# Patient Record
Sex: Female | Born: 1995 | Race: White | Hispanic: No | Marital: Single | Smoking: Never smoker
Health system: Southern US, Community
[De-identification: ages and names within clinical notes are randomized; demographics above are authoritative.]

## PROBLEM LIST (undated history)

## (undated) ENCOUNTER — Inpatient Hospital Stay: Payer: Self-pay

## (undated) DIAGNOSIS — Z9889 Other specified postprocedural states: Secondary | ICD-10-CM

## (undated) DIAGNOSIS — R519 Headache, unspecified: Secondary | ICD-10-CM

## (undated) DIAGNOSIS — D649 Anemia, unspecified: Secondary | ICD-10-CM

## (undated) DIAGNOSIS — R112 Nausea with vomiting, unspecified: Secondary | ICD-10-CM

## (undated) DIAGNOSIS — T4145XA Adverse effect of unspecified anesthetic, initial encounter: Secondary | ICD-10-CM

## (undated) DIAGNOSIS — T8859XA Other complications of anesthesia, initial encounter: Secondary | ICD-10-CM

## (undated) DIAGNOSIS — K219 Gastro-esophageal reflux disease without esophagitis: Secondary | ICD-10-CM

## (undated) HISTORY — PX: CHOLECYSTECTOMY: SHX55

## (undated) HISTORY — PX: WISDOM TOOTH EXTRACTION: SHX21

---
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
## 2015-08-21 ENCOUNTER — Encounter: Payer: Self-pay | Admitting: Emergency Medicine

## 2015-08-21 ENCOUNTER — Emergency Department
Admission: EM | Admit: 2015-08-21 | Discharge: 2015-08-22 | Disposition: A | Discharge status: Home / Self Care | Payer: Self-pay | Attending: Emergency Medicine | Admitting: Emergency Medicine

## 2015-08-21 DIAGNOSIS — J020 Streptococcal pharyngitis: Secondary | ICD-10-CM | POA: Insufficient documentation

## 2015-08-21 NOTE — ED Notes (Signed)
Pt arrived to the ED for complaints of sore throat for 1 day. Pt states that had strep throat many times before and this feels like it. Pt's tonsils are +2, Pt AOx4 in no apparent distress.

## 2015-08-21 NOTE — ED Provider Notes (Signed)
[facility] Emergency Department Provider Note  ____________________________________________  Time seen: Approximately 11:54 PM  I have reviewed the triage vital signs and the nursing notes.   HISTORY  Chief Complaint Sore Throat    HPI Janet Greer is a 20 y.o. female who presents emergency department for a sore throat times one day. Per the patient patient has had strep throat many times. She states that symptoms began rather [place]. She endorses fevers and chills, no cough, swelling of lymph nodes. Patient denies any nasal congestion, headache, visual acuity changes, neck pain, chest pain, abdominal pain, nausea or vomiting.   History reviewed. No pertinent past medical history.  There are no active problems to display for this patient.   History reviewed. No pertinent past surgical history.  Current Outpatient Rx  Name  Route  Sig  Dispense  Refill  . amoxicillin (AMOXIL) 875 MG tablet   Oral   Take 1 tablet (875 mg total) by mouth 2 (two) times daily.   14 tablet   0   . magic mouthwash w/lidocaine SOLN   Oral   Take 5 mLs by mouth 4 (four) times daily.   240 mL   0     Dispense in a 1/1/1/1 ratio. Use lidocaine, diphen ...     Allergies Review of patient's allergies indicates no known allergies.  History reviewed. No pertinent family history.  Social History Social History  Substance Use Topics  . Smoking status: Never Smoker   . Smokeless tobacco: None  . Alcohol Use: No     Review of Systems  Constitutional: Positive for fever/chills ENT: Positive for sore throat. Negative for nasal congestion. Cardiovascular: no chest pain. Respiratory: no cough. No SOB. Gastrointestinal: No abdominal pain.  No nausea, no vomiting.   Musculoskeletal: Negative for back pain. Skin: Negative for rash. Neurological: Negative for headaches, focal weakness or numbness. 10-point ROS otherwise  negative.  ____________________________________________   PHYSICAL EXAM:  VITAL SIGNS: ED Triage Vitals  Enc Vitals Group     BP 08/21/15 2204 128/72 mmHg     Pulse Rate 08/21/15 2204 120     Resp 08/21/15 2204 18     Temp 08/21/15 2204 98.4 F (36.9 C)     Temp Source 08/21/15 2204 Oral     SpO2 08/21/15 2204 99 %     Weight 08/21/15 2205 181 lb (82.101 kg)     Height 08/21/15 2204 5' (1.524 m)     Head Cir --      Peak Flow --      Pain Score 08/21/15 2205 8     Pain Loc --      Pain Edu? --      Excl. in GC? --      Constitutional: Alert and oriented. Well appearing and in no acute distress. Eyes: Conjunctivae are normal. PERRL. EOMI. Head: Atraumatic. ENT:      Ears: EACs and TMs are unremarkable bilaterally.      Nose: No congestion/rhinnorhea.      Mouth/Throat: Mucous membranes are moist. Oropharynx is erythematous. Uvula is midline. Tonsils are erythematous and edematous with white exudates. Neck: No stridor.   Hematological/Lymphatic/Immunilogical: Diffuse, tender, mobile anterior cervical lymphadenopathy. Cardiovascular: Normal rate, regular rhythm. Normal S1 and S2.  Good peripheral circulation. Respiratory: Normal respiratory effort without tachypnea or retractions. Lungs CTAB. Musculoskeletal: No lower extremity tenderness nor edema.  No joint effusions. Neurologic:  Normal speech and language. No gross focal neurologic deficits are appreciated.  Skin:  Skin is warm, dry and intact. No rash noted. Psychiatric: Mood and affect are normal. Speech and behavior are normal. Patient exhibits appropriate insight and judgement.   ____________________________________________   LABS (all labs ordered are listed, but only abnormal results are displayed)  Labs Reviewed  CULTURE, GROUP A STREP [facility])   ____________________________________________  EKG   ____________________________________________  RADIOLOGY   No results  found.  ____________________________________________    PROCEDURES  Procedure(s) performed:       Medications - No data to display   ____________________________________________   INITIAL IMPRESSION / ASSESSMENT AND PLAN / ED COURSE  Pertinent labs & imaging results that were available during my care of the patient were reviewed by me and considered in my medical decision making (see chart for details).  Patient's diagnosis is consistent with strep throat. Patient has a negative rapid strep exam but needs 4 out of 5 Centor criteria. Patient will be treated with antibiotics and magic mouthwash for symptom control. She is to take Tylenol and ibuprofen at home for additional symptom control..Patient is to follow up with primary care provider if symptoms persist past this treatment course. Patient is given ED precautions to return to the ED for any worsening or new symptoms.     ____________________________________________  FINAL CLINICAL IMPRESSION(S) / ED DIAGNOSES  Final diagnoses:  Strep throat      NEW MEDICATIONS STARTED DURING THIS VISIT:  New Prescriptions   AMOXICILLIN (AMOXIL) 875 MG TABLET    Take 1 tablet (875 mg total) by mouth 2 (two) times daily.   MAGIC MOUTHWASH W/LIDOCAINE SOLN    Take 5 mLs by mouth 4 (four) times daily.       Delorise Royals Cuthriell, PA-C 08/22/15 0004  Phineas Semen, MD 08/22/15 [phone]

## 2015-08-22 LAB — POCT RAPID STREP A: Streptococcus, Group A Screen (Direct): NEGATIVE

## 2015-08-22 MED ORDER — AMOXICILLIN 875 MG PO TABS: 875.0000 mg | ORAL_TABLET | Freq: Two times a day (BID) | ORAL | Status: DC

## 2015-08-22 MED ORDER — AMOXICILLIN-POT CLAVULANATE 875-125 MG PO TABS
ORAL_TABLET | ORAL | Status: AC
  Administered 2015-08-22: 1 via ORAL
  Filled 2015-08-22: qty 1

## 2015-08-22 MED ORDER — MAGIC MOUTHWASH W/LIDOCAINE: 5.0000 mL | Freq: Four times a day (QID) | ORAL | Status: DC

## 2015-08-22 MED ORDER — AMOXICILLIN-POT CLAVULANATE 875-125 MG PO TABS
1.0000 | ORAL_TABLET | Freq: Once | ORAL | Status: AC
  Administered 2015-08-22: 1 via ORAL

## 2015-08-22 NOTE — Discharge Instructions (Signed)

## 2015-08-23 LAB — CULTURE, GROUP A STREP (THRC)

## 2015-10-02 ENCOUNTER — Emergency Department
Admission: EM | Admit: 2015-10-02 | Discharge: 2015-10-02 | Disposition: A | Discharge status: Home / Self Care | Payer: Self-pay | Attending: Emergency Medicine | Admitting: Emergency Medicine

## 2015-10-02 DIAGNOSIS — N3001 Acute cystitis with hematuria: Secondary | ICD-10-CM | POA: Insufficient documentation

## 2015-10-02 DIAGNOSIS — Z792 Long term (current) use of antibiotics: Secondary | ICD-10-CM | POA: Insufficient documentation

## 2015-10-02 DIAGNOSIS — Z3202 Encounter for pregnancy test, result negative: Secondary | ICD-10-CM | POA: Insufficient documentation

## 2015-10-02 DIAGNOSIS — Z79899 Other long term (current) drug therapy: Secondary | ICD-10-CM | POA: Insufficient documentation

## 2015-10-02 DIAGNOSIS — F17200 Nicotine dependence, unspecified, uncomplicated: Secondary | ICD-10-CM | POA: Insufficient documentation

## 2015-10-02 LAB — URINALYSIS COMPLETE WITH MICROSCOPIC (ARMC ONLY)
BACTERIA UA: NONE SEEN
BILIRUBIN URINE: NEGATIVE
GLUCOSE, UA: NEGATIVE mg/dL
Ketones, ur: NEGATIVE mg/dL
NITRITE: NEGATIVE
Protein, ur: 100 mg/dL — AB
SPECIFIC GRAVITY, URINE: 1.028 (ref 1.005–1.030)
pH: 6 (ref 5.0–8.0)

## 2015-10-02 LAB — POCT PREGNANCY, URINE: PREG TEST UR: NEGATIVE

## 2015-10-02 MED ORDER — CEPHALEXIN 500 MG PO CAPS: 500.0000 mg | ORAL_CAPSULE | Freq: Four times a day (QID) | ORAL | Status: AC

## 2015-10-02 MED ORDER — ONDANSETRON 4 MG PO TBDP: 4.0000 mg | ORAL_TABLET | Freq: Three times a day (TID) | ORAL | Status: DC | PRN

## 2015-10-02 MED ORDER — CEPHALEXIN 500 MG PO CAPS
500.0000 mg | ORAL_CAPSULE | Freq: Once | ORAL | Status: AC
  Administered 2015-10-02: 500 mg via ORAL
  Filled 2015-10-02: qty 1

## 2015-10-02 MED ORDER — PHENAZOPYRIDINE HCL 100 MG PO TABS: 100.0000 mg | ORAL_TABLET | Freq: Three times a day (TID) | ORAL | Status: DC | PRN

## 2015-10-02 NOTE — Discharge Instructions (Signed)
Urinary Tract Infection Urinary tract infections (UTIs) can develop anywhere along your urinary tract. Your urinary tract is your body's drainage system for removing wastes and extra water. Your urinary tract includes two kidneys, two ureters, a bladder, and a urethra. Your kidneys are a pair of bean-shaped organs. Each kidney is about the size of your fist. They are located below your ribs, one on each side of your spine. CAUSES Infections are caused by microbes, which are microscopic organisms, including fungi, viruses, and bacteria. These organisms are so small that they can only be seen through a microscope. Bacteria are the microbes that most commonly cause UTIs. SYMPTOMS  Symptoms of UTIs may vary by age and gender of the patient and by the location of the infection. Symptoms in young women typically include a frequent and intense urge to urinate and a painful, burning feeling in the bladder or urethra during urination. Older women and men are more likely to be tired, shaky, and weak and have muscle aches and abdominal pain. A fever may mean the infection is in your kidneys. Other symptoms of a kidney infection include pain in your back or sides below the ribs, nausea, and vomiting. DIAGNOSIS To diagnose a UTI, your caregiver will ask you about your symptoms. Your caregiver will also ask you to provide a urine sample. The urine sample will be tested for bacteria and white blood cells. White blood cells are made by your body to help fight infection. TREATMENT  Typically, UTIs can be treated with medication. Because most UTIs are caused by a bacterial infection, they usually can be treated with the use of antibiotics. The choice of antibiotic and length of treatment depend on your symptoms and the type of bacteria causing your infection. HOME CARE INSTRUCTIONS  If you were prescribed antibiotics, take them exactly as your caregiver instructs you. Finish the medication even if you feel better after  you have only taken some of the medication.  Drink enough water and fluids to keep your urine clear or pale yellow.  Avoid caffeine, tea, and carbonated beverages. They tend to irritate your bladder.  Empty your bladder often. Avoid holding urine for long periods of time.  Empty your bladder before and after sexual intercourse.  After a bowel movement, women should cleanse from front to back. Use each tissue only once. SEEK MEDICAL CARE IF:   You have back pain.  You develop a fever.  Your symptoms do not begin to resolve within 3 days. SEEK IMMEDIATE MEDICAL CARE IF:   You have severe back pain or lower abdominal pain.  You develop chills.  You have nausea or vomiting.  You have continued burning or discomfort with urination. MAKE SURE YOU:   Understand these instructions.  Will watch your condition.  Will get help right away if you are not doing well or get worse.   This information is not intended to replace advice given to you by your health care provider. Make sure you discuss any questions you have with your health care provider.   Document Released: 04/15/2005 Document Revised: 03/27/2015 Document Reviewed: 08/14/2011 Elsevier Interactive Patient Education 2016 [employer]  Intravenous Pyelogram, Care After These instructions give you information about caring for yourself after your procedure. Your doctor may also give you more specific instructions. Call your doctor if you have any problems or questions after your procedure. HOME CARE INSTRUCTIONS  Return to your normal activities as told by your doctor. Ask your doctor what activities are safe for  you.  Drink enough fluid to keep your pee (urine) clear or pale yellow.  It is your responsibility to get the results of your procedure. Ask your doctor when your results will be ready. GET HELP IF: You start peeing less than you usually do. GET HELP RIGHT AWAY IF:  You feel sick to your stomach (are  nauseous).  You throw up (vomit).  You have itching.  You have trouble breathing.  Your throat swells.  You have chest pain.  You have chills or a fever.   This information is not intended to replace advice given to you by your health care provider. Make sure you discuss any questions you have with your health care provider.   Document Released: 03/27/2015 Document Reviewed: 09/20/2014 Elsevier Interactive Patient Education [employer]

## 2015-10-02 NOTE — ED Notes (Signed)
Patient presents to ED with chief complaint that she "cannot pee." Symptoms started a day ago. States some left lower quadrant pain but only if she leans to the left. States nausea without vomiting.

## 2015-10-02 NOTE — ED Notes (Signed)
Pt unable to urinate at this time given cup for when is able to void.

## 2015-10-02 NOTE — ED Provider Notes (Signed)
[facility] Emergency Department Provider Note  ____________________________________________  Time seen: Approximately 255 AM  I have reviewed the triage vital signs and the nursing notes.   HISTORY  Chief Complaint Urinary Retention    HPI Janet Greer is a 20 y.o. female who comes into the hospital today with urinary frequency and urgency. The patient reports that she feels as though she has to urinate frequently but whenever she urinates not much comes out. She reports the symptoms started 2 days ago. She reports that she thought it would go away but it didn't. The patient denies pain with urination and when she strains for a hard. She's had some nausea with no vomiting and no fevers. The patient has never had a urinary tract infection before. She has some mild lower abdominal suprapubic pain that she rates a 3 out of 10 in intensity. The patient was unsure what was going on she decided to come in to get checked out today. Initially arrived she told the staff that she could not pee. The patient is not taking anything for pain at home.The patient denies any vaginal discharge, burning, itching or any other vaginal symptoms.   History reviewed. No pertinent past medical history.  There are no active problems to display for this patient.   Past Surgical History  Procedure Laterality Date  . Wisdom tooth extraction      2014    Current Outpatient Rx  Name  Route  Sig  Dispense  Refill  . amoxicillin (AMOXIL) 875 MG tablet   Oral   Take 1 tablet (875 mg total) by mouth 2 (two) times daily.   14 tablet   0   . cephALEXin (KEFLEX) 500 MG capsule   Oral   Take 1 capsule (500 mg total) by mouth 4 (four) times daily.   40 capsule   0   . magic mouthwash w/lidocaine SOLN   Oral   Take 5 mLs by mouth 4 (four) times daily.   240 mL   0     Dispense in a 1/1/1/1 ratio. Use lidocaine, diphen ...   . ondansetron (ZOFRAN ODT) 4 MG disintegrating  tablet   Oral   Take 1 tablet (4 mg total) by mouth every 8 (eight) hours as needed for nausea or vomiting.   20 tablet   0   . phenazopyridine (PYRIDIUM) 100 MG tablet   Oral   Take 1 tablet (100 mg total) by mouth 3 (three) times daily as needed for pain.   10 tablet   0     Allergies Review of patient's allergies indicates no known allergies.  No family history on file.  Social History Social History  Substance Use Topics  . Smoking status: Current Some Day Smoker  . Smokeless tobacco: None  . Alcohol Use: No    Review of Systems Constitutional: No fever/chills Eyes: No visual changes. ENT: No sore throat. Cardiovascular: Denies chest pain. Respiratory: Denies shortness of breath. Gastrointestinal: Suprapubic pain and nausea, no vomiting.  No diarrhea.  No constipation. Genitourinary: Frequency, urgency and hesitancy Musculoskeletal: Negative for back pain. Skin: Negative for rash. Neurological: Negative for headaches, focal weakness or numbness.  10-point ROS otherwise negative.  ____________________________________________   PHYSICAL EXAM:  VITAL SIGNS: ED Triage Vitals  Enc Vitals Group     BP 10/02/15 0044 112/68 mmHg     Pulse Rate 10/02/15 0044 94     Resp 10/02/15 0044 18     Temp 10/02/15 0044  98 F (36.7 C)     Temp Source 10/02/15 0044 Oral     SpO2 10/02/15 0044 98 %     Weight 10/02/15 0044 170 lb (77.111 kg)     Height 10/02/15 0044 5' (1.524 m)     Head Cir --      Peak Flow --      Pain Score 10/02/15 0038 5     Pain Loc --      Pain Edu? --      Excl. in GC? --     Constitutional: Alert and oriented. Well appearing and in Mild distress. Eyes: Conjunctivae are normal. PERRL. EOMI. Head: Atraumatic. Nose: No congestion/rhinnorhea. Mouth/Throat: Mucous membranes are moist.  Oropharynx non-erythematous. Cardiovascular: Normal rate, regular rhythm. Grossly normal heart sounds.  Good peripheral circulation. Respiratory: Normal  respiratory effort.  No retractions. Lungs CTAB. Gastrointestinal: Soft with mild suprapubic tenderness to palpation No distention. Positive bowel sounds Musculoskeletal: No lower extremity tenderness nor edema.   Neurologic:  Normal speech and language.  Skin:  Skin is warm, dry and intact.  Psychiatric: Mood and affect are normal.   ____________________________________________   LABS (all labs ordered are listed, but only abnormal results are displayed)  Labs Reviewed  URINALYSIS COMPLETEWITH MICROSCOPIC ([facility] ONLY) - Abnormal; Notable for the following:    Color, Urine YELLOW (*)    APPearance CLOUDY (*)    Hgb urine dipstick 3+ (*)    Protein, ur 100 (*)    Leukocytes, UA 2+ (*)    Squamous Epithelial / LPF 6-30 (*)    All other components within normal limits  URINE CULTURE  POC URINE PREG, ED  POCT PREGNANCY, URINE   ____________________________________________  EKG  None ____________________________________________  RADIOLOGY  None ____________________________________________   PROCEDURES  Procedure(s) performed: None  Critical Care performed: No  ____________________________________________   INITIAL IMPRESSION / ASSESSMENT AND PLAN / ED COURSE  Pertinent labs & imaging results that were available during my care of the patient were reviewed by me and considered in my medical decision making (see chart for details).  This is a 20 year old female who comes into the hospital today with some urinary frequency, urgency and hesitancy. The patient did have a urinalysis performed that showed too numerous to count white blood cells into her scalp red blood cells. I feel the patient has urinary tract infection at this time. I will send a urine culture and give the patient a dose of Keflex to treat her symptoms. The patient will be discharged with a prescription for Keflex and Pyridium. I will encourage the patient to follow-up with the acute care clinic or [facility]. The patient has no further questions or concerns she'll be discharged home. ____________________________________________   FINAL CLINICAL IMPRESSION(S) / ED DIAGNOSES  Final diagnoses:  Acute cystitis with hematuria      Rebecka ApleyAllison P Sweet Jarvis, MD 10/02/15 [id number]

## 2015-10-02 NOTE — ED Notes (Signed)
Bladder scan done. of urine in bladder.

## 2015-10-04 LAB — URINE CULTURE: Culture: >=100000

## 2015-12-12 ENCOUNTER — Emergency Department: Payer: Self-pay

## 2015-12-12 ENCOUNTER — Encounter: Payer: Self-pay | Admitting: Emergency Medicine

## 2015-12-12 ENCOUNTER — Emergency Department
Admission: EM | Admit: 2015-12-12 | Discharge: 2015-12-12 | Disposition: A | Discharge status: Home / Self Care | Payer: Self-pay | Attending: Emergency Medicine | Admitting: Emergency Medicine

## 2015-12-12 DIAGNOSIS — Z79899 Other long term (current) drug therapy: Secondary | ICD-10-CM | POA: Insufficient documentation

## 2015-12-12 DIAGNOSIS — Z87891 Personal history of nicotine dependence: Secondary | ICD-10-CM | POA: Insufficient documentation

## 2015-12-12 DIAGNOSIS — Z792 Long term (current) use of antibiotics: Secondary | ICD-10-CM | POA: Insufficient documentation

## 2015-12-12 DIAGNOSIS — M940 Chondrocostal junction syndrome [Tietze]: Secondary | ICD-10-CM | POA: Insufficient documentation

## 2015-12-12 LAB — CBC
HCT: 36.6 % (ref 35.0–47.0)
HEMOGLOBIN: 11.8 g/dL — AB (ref 12.0–16.0)
MCH: 24.4 pg — AB (ref 26.0–34.0)
MCHC: 32.3 g/dL (ref 32.0–36.0)
MCV: 75.6 fL — ABNORMAL LOW (ref 80.0–100.0)
Platelets: 318 10*3/uL (ref 150–440)
RBC: 4.84 MIL/uL (ref 3.80–5.20)
RDW: 15.5 % — ABNORMAL HIGH (ref 11.5–14.5)
WBC: 11 10*3/uL (ref 3.6–11.0)

## 2015-12-12 LAB — BASIC METABOLIC PANEL
Anion gap: 7 (ref 5–15)
BUN: 12 mg/dL (ref 6–20)
CALCIUM: 9 mg/dL (ref 8.9–10.3)
CO2: 25 mmol/L (ref 22–32)
CREATININE: 0.76 mg/dL (ref 0.44–1.00)
Chloride: 108 mmol/L (ref 101–111)
GFR calc Af Amer: >60 mL/min (ref >60)
GFR calc non Af Amer: >60 mL/min (ref >60)
GLUCOSE: 90 mg/dL (ref 65–99)
Potassium: 3.4 mmol/L — ABNORMAL LOW (ref 3.5–5.1)
Sodium: 140 mmol/L (ref 135–145)

## 2015-12-12 LAB — TROPONIN I

## 2015-12-12 MED ORDER — KETOROLAC TROMETHAMINE 10 MG PO TABS
10.0000 mg | ORAL_TABLET | Freq: Once | ORAL | Status: AC
  Administered 2015-12-12: 10 mg via ORAL
  Filled 2015-12-12: qty 1

## 2015-12-12 MED ORDER — KETOROLAC TROMETHAMINE 10 MG PO TABS: 10.0000 mg | ORAL_TABLET | Freq: Three times a day (TID) | ORAL | Status: DC | PRN

## 2015-12-12 NOTE — ED Notes (Signed)
Patient ambulatory to triage with steady gait, without difficulty or distress noted; pt reports upper CP x 5 days with no accomp symptoms; denies hx of same

## 2015-12-12 NOTE — ED Provider Notes (Addendum)
[facility] Emergency Department Provider Note  ____________________________________________  Time seen: 5:00 AM  I have reviewed the triage vital signs and the nursing notes.   HISTORY  Chief Complaint Chest Pain      HPI Janet Greer is a 20 y.o. female sharp midline chest pain intermittent 5 days worse with movement and deep breath. Currently 4 out of 10 on pain scale. Patient denies any lower external pain or swelling. Patient states she recently started a very physically strenuous job and she thinks the pain may be attributed to that.    Past medical history None There are no active problems to display for this patient.   Past Surgical History  Procedure Laterality Date  . Wisdom tooth extraction      2014    Current Outpatient Rx  Name  Route  Sig  Dispense  Refill  . amoxicillin (AMOXIL) 875 MG tablet   Oral   Take 1 tablet (875 mg total) by mouth 2 (two) times daily.   14 tablet   0   . ketorolac (TORADOL) 10 MG tablet   Oral   Take 1 tablet (10 mg total) by mouth every 8 (eight) hours as needed.   20 tablet   0   . magic mouthwash w/lidocaine SOLN   Oral   Take 5 mLs by mouth 4 (four) times daily.   240 mL   0     Dispense in a 1/1/1/1 ratio. Use lidocaine, diphen ...   . ondansetron (ZOFRAN ODT) 4 MG disintegrating tablet   Oral   Take 1 tablet (4 mg total) by mouth every 8 (eight) hours as needed for nausea or vomiting.   20 tablet   0   . phenazopyridine (PYRIDIUM) 100 MG tablet   Oral   Take 1 tablet (100 mg total) by mouth 3 (three) times daily as needed for pain.   10 tablet   0     Allergies No known drug allergies No family history on file.  Social History Social History  Substance Use Topics  . Smoking status: Former Games developermoker  . Smokeless tobacco: None  . Alcohol Use: No    Review of Systems  Constitutional: Negative for fever. Eyes: Negative for visual changes. ENT: Negative for sore  throat. Cardiovascular: Positive for chest pain. Respiratory: Negative for shortness of breath. Gastrointestinal: Negative for abdominal pain, vomiting and diarrhea. Genitourinary: Negative for dysuria. Musculoskeletal: Negative for back pain. Skin: Negative for rash. Neurological: Negative for headaches, focal weakness or numbness.   10-point ROS otherwise negative.  ____________________________________________   PHYSICAL EXAM:  VITAL SIGNS: ED Triage Vitals  Enc Vitals Group     BP 12/12/15 0211 122/80 mmHg     Pulse Rate 12/12/15 0211 64     Resp 12/12/15 0211 18     Temp 12/12/15 0211 97.8 F (36.6 C)     Temp Source 12/12/15 0211 Oral     SpO2 12/12/15 0211 100 %     Weight 12/12/15 0211 190 lb (86.183 kg)     Height 12/12/15 0211 5' (1.524 m)     Head Cir --      Peak Flow --      Pain Score 12/12/15 0209 7     Pain Loc --      Pain Edu? --      Excl. in GC? --      Constitutional: Alert and oriented. Well appearing and in no distress. Eyes: Conjunctivae are normal.  PERRL. Normal extraocular movements. ENT   Head: Normocephalic and atraumatic.   Nose: No congestion/rhinnorhea.   Mouth/Throat: Mucous membranes are moist.   Neck: No stridor. Hematological/Lymphatic/Immunilogical: No cervical lymphadenopathy. Cardiovascular: Normal rate, regular rhythm. Normal and symmetric distal pulses are present in all extremities. No murmurs, rubs, or gallops.Pain with palpation costosternal area bilaterally. Respiratory: Normal respiratory effort without tachypnea nor retractions. Breath sounds are clear and equal bilaterally. No wheezes/rales/rhonchi. Gastrointestinal: Soft and nontender. No distention. There is no CVA tenderness. Genitourinary: deferred Musculoskeletal: Nontender with normal range of motion in all extremities. No joint effusions.  No lower extremity tenderness nor edema. Neurologic:  Normal speech and language. No gross focal neurologic  deficits are appreciated. Speech is normal.  Skin:  Skin is warm, dry and intact. No rash noted. Psychiatric: Mood and affect are normal. Speech and behavior are normal. Patient exhibits appropriate insight and judgment.   ED ECG REPORT I, Rome N BROWN, the attending physician, personally viewed and interpreted this ECG.   Date: 12/12/2015  EKG Time: 2:12 AM  Rate: 76  Rhythm: Normal sinus rhythm  Axis: Normal  Intervals: Normal  ST&T Change: None   INITIAL IMPRESSION / ASSESSMENT AND PLAN / ED COURSE  Pertinent labs & imaging results that were available during my care of the patient were reviewed by me and considered in my medical decision making (see chart for details).  History physical exam consistent with costochondritis. EKG unremarkable lab data likewise.  ____________________________________________   FINAL CLINICAL IMPRESSION(S) / ED DIAGNOSES  Final diagnoses:  Acute costochondritis      Darci Current, MD 12/12/15 [id number]  Darci Current, MD 12/12/15 [phone]

## 2015-12-12 NOTE — Discharge Instructions (Signed)

## 2015-12-24 ENCOUNTER — Emergency Department (HOSPITAL_COMMUNITY): Payer: Self-pay

## 2015-12-24 ENCOUNTER — Emergency Department (HOSPITAL_COMMUNITY)
Admission: EM | Admit: 2015-12-24 | Discharge: 2015-12-24 | Disposition: A | Discharge status: Home / Self Care | Payer: Self-pay | Attending: Emergency Medicine | Admitting: Emergency Medicine

## 2015-12-24 ENCOUNTER — Encounter (HOSPITAL_COMMUNITY): Payer: Self-pay | Admitting: Emergency Medicine

## 2015-12-24 DIAGNOSIS — R1110 Vomiting, unspecified: Secondary | ICD-10-CM | POA: Insufficient documentation

## 2015-12-24 DIAGNOSIS — R51 Headache: Secondary | ICD-10-CM | POA: Insufficient documentation

## 2015-12-24 DIAGNOSIS — M940 Chondrocostal junction syndrome [Tietze]: Secondary | ICD-10-CM | POA: Insufficient documentation

## 2015-12-24 LAB — BASIC METABOLIC PANEL
ANION GAP: 5 (ref 5–15)
BUN: 12 mg/dL (ref 6–20)
CHLORIDE: 107 mmol/L (ref 101–111)
CO2: 26 mmol/L (ref 22–32)
CREATININE: 0.79 mg/dL (ref 0.44–1.00)
Calcium: 9.2 mg/dL (ref 8.9–10.3)
GFR calc non Af Amer: >60 mL/min (ref >60)
Glucose, Bld: 104 mg/dL — ABNORMAL HIGH (ref 65–99)
Potassium: 3.8 mmol/L (ref 3.5–5.1)
Sodium: 138 mmol/L (ref 135–145)

## 2015-12-24 LAB — I-STAT TROPONIN, ED: Troponin i, poc: 0 ng/mL (ref 0.00–0.08)

## 2015-12-24 LAB — CBC
HCT: 38.2 % (ref 36.0–46.0)
HEMOGLOBIN: 12.4 g/dL (ref 12.0–15.0)
MCH: 25 pg — AB (ref 26.0–34.0)
MCHC: 32.5 g/dL (ref 30.0–36.0)
MCV: 77 fL — AB (ref 78.0–100.0)
Platelets: 305 10*3/uL (ref 150–400)
RBC: 4.96 MIL/uL (ref 3.87–5.11)
RDW: 14.1 % (ref 11.5–15.5)
WBC: 7.6 10*3/uL (ref 4.0–10.5)

## 2015-12-24 MED ORDER — CYCLOBENZAPRINE HCL 10 MG PO TABS
5.0000 mg | ORAL_TABLET | Freq: Once | ORAL | Status: AC
  Administered 2015-12-24: 5 mg via ORAL
  Filled 2015-12-24: qty 1

## 2015-12-24 MED ORDER — IBUPROFEN 400 MG PO TABS: 400.0000 mg | ORAL_TABLET | Freq: Four times a day (QID) | ORAL | Status: DC | PRN

## 2015-12-24 MED ORDER — IBUPROFEN 200 MG PO TABS
600.0000 mg | ORAL_TABLET | Freq: Once | ORAL | Status: AC
  Administered 2015-12-24: 600 mg via ORAL
  Filled 2015-12-24: qty 3

## 2015-12-24 MED ORDER — CYCLOBENZAPRINE HCL 5 MG PO TABS: 5.0000 mg | ORAL_TABLET | Freq: Three times a day (TID) | ORAL | Status: DC

## 2015-12-24 NOTE — ED Provider Notes (Signed)
CSN: [id number]     Arrival date & time 12/24/15  1711 History   First MD Initiated Contact with Patient 12/24/15 2052     Chief Complaint  Patient presents with  . Chest Pain  . Headache  . Emesis     (Consider location/radiation/quality/duration/timing/severity/associated sxs/prior Treatment) HPI Comments: She will recurrent upper bilateral chest discomfort that is reproducible with palpation, certainly states she was treated at [facility] with ibuprofen really didn't change anything.  It went away on its own, only to return on Saturday.  She denies any trauma.  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  She denies any coughing or sneezing.  She has not taken anything for her discomfort.  Patient is a 20 y.o. female presenting with chest pain, headaches, and vomiting. The history is provided by the patient.  Chest Pain Pain location:  L chest and R chest Pain quality: aching   Pain radiates to:  Does not radiate Pain radiates to the back: no   Pain severity:  Mild Onset quality:  Gradual Timing:  Constant Progression:  Unchanged Chronicity:  Recurrent Context: lifting and movement   Relieved by:  Nothing Worsened by:  Movement and certain positions Associated symptoms: headache and vomiting   Headache Associated symptoms: vomiting   Emesis Associated symptoms: headaches     History reviewed. No pertinent past medical history. Past Surgical History  Procedure Laterality Date  . Wisdom tooth extraction      2014   No family history on file. Social History  Substance Use Topics  . Smoking status: Former Games developer  . Smokeless tobacco: None  . Alcohol Use: No   OB History    No data available     Review of Systems  Cardiovascular: Positive for chest pain.  Gastrointestinal: Positive for vomiting.  Neurological: Positive for headaches.      Allergies  Review of patient's allergies indicates no known allergies.  Home Medications   Prior to  Admission medications   Medication Sig Start Date End Date Taking? Authorizing Provider  amoxicillin (AMOXIL) 875 MG tablet Take 1 tablet (875 mg total) by mouth 2 (two) times daily. 08/22/15   Delorise Royals Cuthriell, PA-C  cyclobenzaprine (FLEXERIL) 5 MG tablet Take 1 tablet (5 mg total) by mouth 3 (three) times daily. 12/24/15   Earley Favor, NP  ibuprofen (ADVIL,MOTRIN) 400 MG tablet Take 1 tablet (400 mg total) by mouth every 6 (six) hours as needed. 12/24/15   Earley Favor, NP  ketorolac (TORADOL) 10 MG tablet Take 1 tablet (10 mg total) by mouth every 8 (eight) hours as needed. 12/12/15   Darci Current, MD  magic mouthwash w/lidocaine SOLN Take 5 mLs by mouth 4 (four) times daily. 08/22/15   Delorise Royals Cuthriell, PA-C  ondansetron (ZOFRAN ODT) 4 MG disintegrating tablet Take 1 tablet (4 mg total) by mouth every 8 (eight) hours as needed for nausea or vomiting. 10/02/15   Rebecka Apley, MD  phenazopyridine (PYRIDIUM) 100 MG tablet Take 1 tablet (100 mg total) by mouth 3 (three) times daily as needed for pain. 10/02/15   Rebecka Apley, MD   BP 110/71 mmHg  Pulse 105  Temp(Src) 98 F (36.7 C) (Oral)  Resp 17  SpO2 100%  LMP 12/12/2015 (Exact Date) Physical Exam  Constitutional: She appears well-developed and well-nourished.  HENT:  Head: Normocephalic.  Eyes: Pupils are equal, round, and reactive to light.  Cardiovascular: Normal rate and regular rhythm.   Pulmonary/Chest: Effort  normal. She exhibits tenderness.    Nursing note and vitals reviewed.   ED Course  Procedures (including critical care time) Labs Review Labs Reviewed  BASIC METABOLIC PANEL - Abnormal; Notable for the following:    Glucose, Bld 104 (*)    All other components within normal limits  CBC - Abnormal; Notable for the following:    MCV 77.0 (*)    MCH 25.0 (*)    All other components within normal limits  I-STAT TROPOININ, ED    Imaging Review Dg Chest 2 View  12/24/2015  CLINICAL DATA:  Central chest  pain for 3 days, initial encounter EXAM: CHEST  2 VIEW COMPARISON:  12/12/2015 FINDINGS: The heart size and mediastinal contours are within normal limits. Both lungs are clear. The visualized skeletal structures are unremarkable. IMPRESSION: No active cardiopulmonary disease. Electronically Signed   By: Alcide CleverMark  Lukens M.D.   On: 12/24/2015 18:07   I have personally reviewed and evaluated these images and lab results as part of my medical decision-making.   EKG Interpretation None     Since lab and x-ray have been reviewed all within normal parameters.  She has reproducible upper bilateral chest discomfort.  I will prescribe Flexeril and ibuprofen on a regular basis MDM   Final diagnoses:  Costochondritis         Earley FavorGail Andilyn Bettcher, NP 12/24/15 2111  Rolland PorterMark James, MD 01/02/16 [phone]

## 2015-12-24 NOTE — Discharge Instructions (Signed)
Costochondritis Costochondritis is a condition in which the tissue (cartilage) that connects your ribs with your breastbone (sternum) becomes irritated. It causes pain in the chest and rib area. It usually goes away on its own over time. HOME CARE  Avoid activities that wear you out.  Do not strain your ribs. Avoid activities that use your:  Chest.  Belly.  Side muscles.  Put ice on the area for the first 2 days after the pain starts.  Put ice in a plastic bag.  Place a towel between your skin and the bag.  Leave the ice on for 20 minutes, 2-3 times a day.  Only take medicine as told by your doctor. GET HELP IF:  You have redness or puffiness (swelling) in the rib area.  Your pain does not go away with rest or medicine. GET HELP RIGHT AWAY IF:   Your pain gets worse.  You are very uncomfortable.  You have trouble breathing.  You cough up blood.  You start sweating or throwing up (vomiting).  You have a fever or lasting symptoms for more than 2-3 days.  You have a fever and your symptoms suddenly get worse. MAKE SURE YOU:   Understand these instructions.  Will watch your condition.  Will get help right away if you are not doing well or get worse.   This information is not intended to replace advice given to you by your health care provider. Make sure you discuss any questions you have with your health care provider.   Document Released: 12/23/2007 Document Revised: 03/08/2013 Document Reviewed: 02/07/2013 Elsevier Interactive Patient Education [employer]2016 Elsevier Inc. Take the medication as directed

## 2015-12-24 NOTE — ED Notes (Signed)
Patient c/o chest pain that started today that is generalized, headache and vomiting twice.  Patient states that [facility] was [facility] for chest pain for several days and finally went away.

## 2016-02-01 ENCOUNTER — Emergency Department (HOSPITAL_COMMUNITY): Payer: Self-pay

## 2016-02-01 ENCOUNTER — Encounter (HOSPITAL_COMMUNITY): Payer: Self-pay | Admitting: Emergency Medicine

## 2016-02-01 ENCOUNTER — Emergency Department (HOSPITAL_COMMUNITY)
Admission: EM | Admit: 2016-02-01 | Discharge: 2016-02-01 | Disposition: A | Discharge status: Home / Self Care | Payer: Self-pay | Attending: Emergency Medicine | Admitting: Emergency Medicine

## 2016-02-01 DIAGNOSIS — Z87891 Personal history of nicotine dependence: Secondary | ICD-10-CM | POA: Insufficient documentation

## 2016-02-01 DIAGNOSIS — N390 Urinary tract infection, site not specified: Secondary | ICD-10-CM

## 2016-02-01 LAB — COMPREHENSIVE METABOLIC PANEL
ALK PHOS: 66 U/L (ref 38–126)
ALT: 37 U/L (ref 14–54)
ANION GAP: 11 (ref 5–15)
AST: 33 U/L (ref 15–41)
Albumin: 4.4 g/dL (ref 3.5–5.0)
BILIRUBIN TOTAL: 1 mg/dL (ref 0.3–1.2)
BUN: 17 mg/dL (ref 6–20)
CALCIUM: 9.5 mg/dL (ref 8.9–10.3)
CO2: 22 mmol/L (ref 22–32)
Chloride: 106 mmol/L (ref 101–111)
Creatinine, Ser: 0.89 mg/dL (ref 0.44–1.00)
GFR calc non Af Amer: >60 mL/min (ref >60)
Glucose, Bld: 96 mg/dL (ref 65–99)
Potassium: 3.9 mmol/L (ref 3.5–5.1)
SODIUM: 139 mmol/L (ref 135–145)
TOTAL PROTEIN: 8.6 g/dL — AB (ref 6.5–8.1)

## 2016-02-01 LAB — URINALYSIS, ROUTINE W REFLEX MICROSCOPIC
Glucose, UA: NEGATIVE mg/dL
Hgb urine dipstick: NEGATIVE
Ketones, ur: 15 mg/dL — AB
Nitrite: NEGATIVE
Protein, ur: NEGATIVE mg/dL
Specific Gravity, Urine: 1.029 (ref 1.005–1.030)
pH: 5.5 (ref 5.0–8.0)

## 2016-02-01 LAB — CBC
HEMATOCRIT: 38.8 % (ref 36.0–46.0)
HEMOGLOBIN: 12.6 g/dL (ref 12.0–15.0)
MCH: 25.3 pg — ABNORMAL LOW (ref 26.0–34.0)
MCHC: 32.5 g/dL (ref 30.0–36.0)
MCV: 77.9 fL — ABNORMAL LOW (ref 78.0–100.0)
PLATELETS: 351 10*3/uL (ref 150–400)
RBC: 4.98 MIL/uL (ref 3.87–5.11)
RDW: 14.7 % (ref 11.5–15.5)
WBC: 14.7 10*3/uL — AB (ref 4.0–10.5)

## 2016-02-01 LAB — LIPASE, BLOOD: Lipase: 21 U/L (ref 11–51)

## 2016-02-01 LAB — URINE MICROSCOPIC-ADD ON: RBC / HPF: NONE SEEN RBC/hpf (ref 0–5)

## 2016-02-01 LAB — I-STAT TROPONIN, ED: Troponin i, poc: 0 ng/mL (ref 0.00–0.08)

## 2016-02-01 LAB — I-STAT BETA HCG BLOOD, ED (MC, WL, AP ONLY)

## 2016-02-01 MED ORDER — SODIUM CHLORIDE 0.9 % IV BOLUS (SEPSIS)
1000.0000 mL | Freq: Once | INTRAVENOUS | Status: AC
Start: 2016-02-01 — End: 2016-02-01
  Administered 2016-02-01: 1000 mL via INTRAVENOUS

## 2016-02-01 MED ORDER — DEXTROSE 5 % IV SOLN
1.0000 g | Freq: Once | INTRAVENOUS | Status: AC
  Administered 2016-02-01: 1 g via INTRAVENOUS
  Filled 2016-02-01: qty 10

## 2016-02-01 MED ORDER — CEPHALEXIN 500 MG PO CAPS: 500.0000 mg | ORAL_CAPSULE | Freq: Four times a day (QID) | ORAL | Status: DC

## 2016-02-01 MED ORDER — ONDANSETRON HCL 4 MG/2ML IJ SOLN
4.0000 mg | Freq: Once | INTRAMUSCULAR | Status: AC
  Administered 2016-02-01: 4 mg via INTRAVENOUS
  Filled 2016-02-01: qty 2

## 2016-02-01 MED ORDER — ONDANSETRON HCL 4 MG PO TABS: 4.0000 mg | ORAL_TABLET | Freq: Four times a day (QID) | ORAL | Status: DC

## 2016-02-01 NOTE — Discharge Instructions (Signed)
Your symptoms are likely due to urinary tract infection. You will be treated for this with antibiotics. Please take all of your medications as prescribed, do not save or share them. Please follow-up with your doctor in 3 days for reevaluation. Return to ED for any new or worsening symptoms as we discussed.  Urinary Tract Infection Urinary tract infections (UTIs) can develop anywhere along your urinary tract. Your urinary tract is your body's drainage system for removing wastes and extra water. Your urinary tract includes two kidneys, two ureters, a bladder, and a urethra. Your kidneys are a pair of bean-shaped organs. Each kidney is about the size of your fist. They are located below your ribs, one on each side of your spine. CAUSES Infections are caused by microbes, which are microscopic organisms, including fungi, viruses, and bacteria. These organisms are so small that they can only be seen through a microscope. Bacteria are the microbes that most commonly cause UTIs. SYMPTOMS  Symptoms of UTIs may vary by age and gender of the patient and by the location of the infection. Symptoms in young women typically include a frequent and intense urge to urinate and a painful, burning feeling in the bladder or urethra during urination. Older women and men are more likely to be tired, shaky, and weak and have muscle aches and abdominal pain. A fever may mean the infection is in your kidneys. Other symptoms of a kidney infection include pain in your back or sides below the ribs, nausea, and vomiting. DIAGNOSIS To diagnose a UTI, your caregiver will ask you about your symptoms. Your caregiver will also ask you to provide a urine sample. The urine sample will be tested for bacteria and white blood cells. White blood cells are made by your body to help fight infection. TREATMENT  Typically, UTIs can be treated with medication. Because most UTIs are caused by a bacterial infection, they usually can be treated with the  use of antibiotics. The choice of antibiotic and length of treatment depend on your symptoms and the type of bacteria causing your infection. HOME CARE INSTRUCTIONS  If you were prescribed antibiotics, take them exactly as your caregiver instructs you. Finish the medication even if you feel better after you have only taken some of the medication.  Drink enough water and fluids to keep your urine clear or pale yellow.  Avoid caffeine, tea, and carbonated beverages. They tend to irritate your bladder.  Empty your bladder often. Avoid holding urine for long periods of time.  Empty your bladder before and after sexual intercourse.  After a bowel movement, women should cleanse from front to back. Use each tissue only once. SEEK MEDICAL CARE IF:   You have back pain.  You develop a fever.  Your symptoms do not begin to resolve within 3 days. SEEK IMMEDIATE MEDICAL CARE IF:   You have severe back pain or lower abdominal pain.  You develop chills.  You have nausea or vomiting.  You have continued burning or discomfort with urination. MAKE SURE YOU:   Understand these instructions.  Will watch your condition.  Will get help right away if you are not doing well or get worse.   This information is not intended to replace advice given to you by your health care provider. Make sure you discuss any questions you have with your health care provider.   Document Released: 04/15/2005 Document Revised: 03/27/2015 Document Reviewed: 08/14/2011 Elsevier Interactive Patient Education [employer]2016 Elsevier Inc.

## 2016-02-01 NOTE — ED Provider Notes (Signed)
CSN: [id number]     Arrival date & time 02/01/16  1251 History   First MD Initiated Contact with Patient 02/01/16 1600     Chief Complaint  Patient presents with  . Panic Attack  . Emesis     (Consider location/radiation/quality/duration/timing/severity/associated sxs/prior Treatment) HPI Janet Greer is a 20 y.o. female with no significant past medical history here for evaluation of apparent panic attack with vomiting. Patient reports she felt like on Thursday she had a panic attack and experienced subsequent nausea and vomiting. She also reports associated chest tightness and shortness of breath that resolved Thursday. She reports that she is concerned that she might be pregnant by her boyfriend that she recently broke up with. States she took a home pregnancy test was positive. She reports her last cycle was last month, was normal for her. She does report she has irregular periods. Denies any unusual vaginal bleeding or discharge. No abdominal pain, back pain, fevers or chills. No recent travel, surgery, OCP use, unilateral leg swelling, history of blood clot, hemoptysis. Nothing makes the problem better or worse. No other modifying factors.  History reviewed. No pertinent past medical history. Past Surgical History  Procedure Laterality Date  . Wisdom tooth extraction      2014   No family history on file. Social History  Substance Use Topics  . Smoking status: Former Games developermoker  . Smokeless tobacco: None  . Alcohol Use: No   OB History    No data available     Review of Systems A 10 point review of systems was completed and was negative except for pertinent positives and negatives as mentioned in the history of present illness     Allergies  Review of patient's allergies indicates no known allergies.  Home Medications   Prior to Admission medications   Medication Sig Start Date End Date Taking? Authorizing Provider  amoxicillin (AMOXIL) 875 MG tablet Take 1 tablet  (875 mg total) by mouth 2 (two) times daily. Patient not taking: Reported on 02/01/2016 08/22/15   Delorise RoyalsJonathan D Cuthriell, PA-C  cephALEXin (KEFLEX) 500 MG capsule Take 1 capsule (500 mg total) by mouth 4 (four) times daily. 02/01/16   Joycie PeekBenjamin Cypress Hinkson, PA-C  cyclobenzaprine (FLEXERIL) 5 MG tablet Take 1 tablet (5 mg total) by mouth 3 (three) times daily. Patient not taking: Reported on 02/01/2016 12/24/15   Earley FavorGail Schulz, NP  ibuprofen (ADVIL,MOTRIN) 400 MG tablet Take 1 tablet (400 mg total) by mouth every 6 (six) hours as needed. Patient not taking: Reported on 02/01/2016 12/24/15   Earley FavorGail Schulz, NP  ketorolac (TORADOL) 10 MG tablet Take 1 tablet (10 mg total) by mouth every 8 (eight) hours as needed. Patient not taking: Reported on 02/01/2016 12/12/15   Darci Currentandolph N Brown, MD  magic mouthwash w/lidocaine SOLN Take 5 mLs by mouth 4 (four) times daily. Patient not taking: Reported on 02/01/2016 08/22/15   Delorise RoyalsJonathan D Cuthriell, PA-C  ondansetron (ZOFRAN ODT) 4 MG disintegrating tablet Take 1 tablet (4 mg total) by mouth every 8 (eight) hours as needed for nausea or vomiting. Patient not taking: Reported on 02/01/2016 10/02/15   Rebecka ApleyAllison P Webster, MD  ondansetron (ZOFRAN) 4 MG tablet Take 1 tablet (4 mg total) by mouth every 6 (six) hours. 02/01/16   Joycie PeekBenjamin Duha Abair, PA-C  phenazopyridine (PYRIDIUM) 100 MG tablet Take 1 tablet (100 mg total) by mouth 3 (three) times daily as needed for pain. Patient not taking: Reported on 02/01/2016 10/02/15   Rebecka ApleyAllison P Webster, MD  BP 129/80 mmHg  Pulse 103  Temp(Src) 98 F (36.7 C) (Oral)  Resp 16  SpO2 100%  LMP 01/02/2016 Physical Exam  Constitutional: She is oriented to person, place, and time. She appears well-developed and well-nourished.  HENT:  Head: Normocephalic and atraumatic.  Mouth/Throat: Oropharynx is clear and moist.  Eyes: Conjunctivae are normal. Pupils are equal, round, and reactive to light. Right eye exhibits no discharge. Left eye exhibits no discharge.  No scleral icterus.  Neck: Neck supple.  Cardiovascular: Normal rate, regular rhythm and normal heart sounds.   Pulmonary/Chest: Effort normal and breath sounds normal. No respiratory distress. She has no wheezes. She has no rales.  Abdominal: Soft. There is tenderness.  Tenderness in suprapubic region. Also expresses mild CVA tenderness. Abdomen is otherwise soft and nondistended without rebound or guarding. No peritoneal signs.  Musculoskeletal: She exhibits no tenderness.  Neurological: She is alert and oriented to person, place, and time.  Cranial Nerves II-XII grossly intact  Skin: Skin is warm and dry. No rash noted.  Psychiatric: She has a normal mood and affect.  Nursing note and vitals reviewed.   ED Course  Procedures (including critical care time) Labs Review Labs Reviewed  CBC - Abnormal; Notable for the following:    WBC 14.7 (*)    MCV 77.9 (*)    MCH 25.3 (*)    All other components within normal limits  COMPREHENSIVE METABOLIC PANEL - Abnormal; Notable for the following:    Total Protein 8.6 (*)    All other components within normal limits  URINALYSIS, ROUTINE W REFLEX MICROSCOPIC (NOT AT [facility]) - Abnormal; Notable for the following:    Color, Urine ORANGE (*)    APPearance CLOUDY (*)    Bilirubin Urine MODERATE (*)    Ketones, ur 15 (*)    Leukocytes, UA MODERATE (*)    All other components within normal limits  URINE MICROSCOPIC-ADD ON - Abnormal; Notable for the following:    Squamous Epithelial / LPF 6-30 (*)    Bacteria, UA MANY (*)    All other components within normal limits  URINE CULTURE  LIPASE, BLOOD  I-STAT TROPOININ, ED  I-STAT BETA HCG BLOOD, ED ([facility], [facility], [facility] ONLY)    Imaging Review Dg Chest 2 View  02/01/2016  CLINICAL DATA:  Sudden onset central chest pain. EXAM: CHEST  2 VIEW COMPARISON:  12/24/2015. FINDINGS: The lungs are clear wiithout focal pneumonia, edema, pneumothorax or pleural effusion. Nodular density/densities projecting over the  lungs are compatible with pads for telemetry leads. The cardiopericardial silhouette is within normal limits for size. The visualized bony structures of the thorax are intact. IMPRESSION: No active cardiopulmonary disease. Electronically Signed   By: Kennith Center M.D.   On: 02/01/2016 14:17   I have personally reviewed and evaluated these images and lab results as part of my medical decision-making.   EKG Interpretation None     Meds given in ED:  Medications  sodium chloride 0.9 % bolus 1,000 mL (0 mLs Intravenous Stopped 02/01/16 1800)  ondansetron (ZOFRAN) injection 4 mg (4 mg Intravenous Given 02/01/16 1628)  cefTRIAXone (ROCEPHIN) 1 g in dextrose 5 % 50 mL IVPB (0 g Intravenous Stopped 02/01/16 1705)    Discharge Medication List as of 02/01/2016  5:41 PM    START taking these medications   Details  cephALEXin (KEFLEX) 500 MG capsule Take 1 capsule (500 mg total) by mouth 4 (four) times daily., Starting 02/01/2016, Until Discontinued, Print    ondansetron [facility])  4 MG tablet Take 1 tablet (4 mg total) by mouth every 6 (six) hours., Starting 02/01/2016, Until Discontinued, Print       Filed Vitals:   02/01/16 1256 02/01/16 1602 02/01/16 1803  BP: 109/79 116/98 129/80  Pulse: 92 106 103  Temp: 98.6 F (37 C) 98.2 F (36.8 C) 98 F (36.7 C)  TempSrc: Oral Oral Oral  Resp: 16 15 16   SpO2: 96% 94% 100%    MDM  Patient's symptoms seem Partially due to anxiety secondary to her concern for possible pregnancy. Suspect tachycardia secondary to anxiety. Her physical exam is underwhelming. Labs significant for a negative beta hCG, troponin negative. She does have evidence of UTI, leukocytosis 14.7. These findings, in addition to emesis, we'll treat for early pyelonephritis. Urine culture obtained. Treated with 1 g ceftriaxone, IV fluids, anti-emetics. Will DC with antibiotics and have patient follow-up with PCP. Tolerating oral fluids in ED. Appears very well, hemodynamically stable and  appropriate for discharge. Final diagnoses:  UTI (lower urinary tract infection)        Joycie Peek, PA-C 02/01/16 1858  Nelva Nay, MD 02/02/16 0021

## 2016-02-01 NOTE — ED Notes (Addendum)
Pt states starting Thursday she has had panic attack and vomiting. C/o chest pain and states she's had panic attacks before but they haven't lasted this long. Pt states she could be pregnant, took a home pregnancy test on Tuesday that was positive.

## 2016-02-01 NOTE — ED Notes (Signed)
She is mildly anxious and is visiting with her sister.

## 2016-02-03 LAB — URINE CULTURE: Culture: 2000 — AB

## 2016-04-01 ENCOUNTER — Emergency Department (HOSPITAL_COMMUNITY)
Admission: EM | Admit: 2016-04-01 | Discharge: 2016-04-01 | Disposition: A | Discharge status: Home / Self Care | Payer: Medicaid Other | Attending: Emergency Medicine | Admitting: Emergency Medicine

## 2016-04-01 ENCOUNTER — Emergency Department (HOSPITAL_COMMUNITY): Payer: Medicaid Other

## 2016-04-01 ENCOUNTER — Encounter: Payer: Self-pay | Admitting: Emergency Medicine

## 2016-04-01 DIAGNOSIS — Z79899 Other long term (current) drug therapy: Secondary | ICD-10-CM | POA: Insufficient documentation

## 2016-04-01 DIAGNOSIS — Z3A01 Less than 8 weeks gestation of pregnancy: Secondary | ICD-10-CM | POA: Insufficient documentation

## 2016-04-01 DIAGNOSIS — O219 Vomiting of pregnancy, unspecified: Secondary | ICD-10-CM | POA: Diagnosis not present

## 2016-04-01 DIAGNOSIS — O26892 Other specified pregnancy related conditions, second trimester: Secondary | ICD-10-CM | POA: Insufficient documentation

## 2016-04-01 DIAGNOSIS — R52 Pain, unspecified: Secondary | ICD-10-CM

## 2016-04-01 DIAGNOSIS — Z87891 Personal history of nicotine dependence: Secondary | ICD-10-CM | POA: Insufficient documentation

## 2016-04-01 DIAGNOSIS — Z791 Long term (current) use of non-steroidal anti-inflammatories (NSAID): Secondary | ICD-10-CM | POA: Insufficient documentation

## 2016-04-01 DIAGNOSIS — O3680X0 Pregnancy with inconclusive fetal viability, not applicable or unspecified: Secondary | ICD-10-CM

## 2016-04-01 LAB — URINALYSIS, ROUTINE W REFLEX MICROSCOPIC
Bilirubin Urine: NEGATIVE
Glucose, UA: NEGATIVE mg/dL
Hgb urine dipstick: NEGATIVE
Ketones, ur: NEGATIVE mg/dL
Nitrite: NEGATIVE
PROTEIN: NEGATIVE mg/dL
SPECIFIC GRAVITY, URINE: 1.022 (ref 1.005–1.030)
pH: 7 (ref 5.0–8.0)

## 2016-04-01 LAB — LIPASE, BLOOD: LIPASE: 18 U/L (ref 11–51)

## 2016-04-01 LAB — COMPREHENSIVE METABOLIC PANEL
ALK PHOS: 52 U/L (ref 38–126)
ALT: 38 U/L (ref 14–54)
ANION GAP: 8 (ref 5–15)
AST: 29 U/L (ref 15–41)
Albumin: 3.7 g/dL (ref 3.5–5.0)
BUN: 10 mg/dL (ref 6–20)
CALCIUM: 8.7 mg/dL — AB (ref 8.9–10.3)
CHLORIDE: 105 mmol/L (ref 101–111)
CO2: 23 mmol/L (ref 22–32)
CREATININE: 0.56 mg/dL (ref 0.44–1.00)
Glucose, Bld: 84 mg/dL (ref 65–99)
Potassium: 3.7 mmol/L (ref 3.5–5.1)
SODIUM: 136 mmol/L (ref 135–145)
Total Bilirubin: 0.5 mg/dL (ref 0.3–1.2)
Total Protein: 7.3 g/dL (ref 6.5–8.1)

## 2016-04-01 LAB — POC URINE PREG, ED: PREG TEST UR: POSITIVE — AB

## 2016-04-01 LAB — CBC
HCT: 36.8 % (ref 36.0–46.0)
HEMOGLOBIN: 12.2 g/dL (ref 12.0–15.0)
MCH: 25.4 pg — AB (ref 26.0–34.0)
MCHC: 33.2 g/dL (ref 30.0–36.0)
MCV: 76.5 fL — ABNORMAL LOW (ref 78.0–100.0)
PLATELETS: 312 10*3/uL (ref 150–400)
RBC: 4.81 MIL/uL (ref 3.87–5.11)
RDW: 14.8 % (ref 11.5–15.5)
WBC: 8.8 10*3/uL (ref 4.0–10.5)

## 2016-04-01 LAB — URINE MICROSCOPIC-ADD ON: RBC / HPF: NONE SEEN RBC/hpf (ref 0–5)

## 2016-04-01 LAB — WET PREP, GENITAL
Sperm: NONE SEEN
Trich, Wet Prep: NONE SEEN
YEAST WET PREP: NONE SEEN

## 2016-04-01 LAB — I-STAT BETA HCG BLOOD, ED (MC, WL, AP ONLY): HCG, QUANTITATIVE: 192.7 m[IU]/mL — AB (ref <5)

## 2016-04-01 LAB — HCG, QUANTITATIVE, PREGNANCY: HCG, BETA CHAIN, QUANT, S: 180 m[IU]/mL — AB (ref <5)

## 2016-04-01 MED ORDER — SODIUM CHLORIDE 0.9 % IV BOLUS (SEPSIS)
1000.0000 mL | Freq: Once | INTRAVENOUS | Status: AC
  Administered 2016-04-01: 1000 mL via INTRAVENOUS

## 2016-04-01 MED ORDER — DOXYLAMINE-PYRIDOXINE 10-10 MG PO TBEC: 1.0000 | DELAYED_RELEASE_TABLET | Freq: Once | ORAL | 0 refills | Status: AC

## 2016-04-01 MED ORDER — PYRIDOXINE HCL 100 MG/ML IJ SOLN
100.0000 mg | Freq: Once | INTRAMUSCULAR | Status: AC
  Administered 2016-04-01: 100 mg via INTRAVENOUS
  Filled 2016-04-01: qty 1

## 2016-04-01 NOTE — ED Triage Notes (Signed)
Pt c/o n/v and abd pain x3 days. Pt states she has taken x4 pregnancy test which were all positive. Pt A+OX4.

## 2016-04-01 NOTE — Discharge Instructions (Signed)
For nausea, and sedative prescription given above, he could take pyridoxine hydrochloride 10 mg, and doxylamine succinate (or Unisom 10mg )

## 2016-04-01 NOTE — ED Provider Notes (Signed)
[facility]-EMERGENCY DEPT Provider Note   CSN: [id number] Arrival date & time: 04/01/16  1221     History   Chief Complaint Chief Complaint  Patient presents with  . Abdominal Pain  . Possible Pregnancy    HPI Janet Greer is a 20 y.o. female.  HPI    Lower abdominal pain, feels like cramping and sharp pain, even taking ibuprofen it doesn't go away. Pain started 3-4 days ago.  Comes and goes.  No vaginal bleeding/discharge, no constipation/diarrhea Emesis twice this AM and yesterday  LMP middle of July, pregancy tests positive No hx of medical problems or hx of pregnnacy. However pt later states had pos pregnancy tests at home followed by negative test at facility and symptoms of miscarriage.  No past medical history on file.  There are no active problems to display for this patient.   Past Surgical History:  Procedure Laterality Date  . WISDOM TOOTH EXTRACTION     2014    OB History    Gravida Para Term Preterm AB Living   1             SAB TAB Ectopic Multiple Live Births                   Home Medications    Prior to Admission medications   Medication Sig Start Date End Date Taking? Authorizing Provider  cephALEXin (KEFLEX) 500 MG capsule Take 1 capsule (500 mg total) by mouth 4 (four) times daily. Patient not taking: Reported on 04/01/2016 02/01/16   Joycie PeekBenjamin Cartner, PA-C  cyclobenzaprine (FLEXERIL) 5 MG tablet Take 1 tablet (5 mg total) by mouth 3 (three) times daily. Patient not taking: Reported on 02/01/2016 12/24/15   Earley FavorGail Schulz, NP  Doxylamine-Pyridoxine 10-10 MG TBEC Take 1 tablet by mouth once. Initial, 2 tablets orally at bedtime on day one and 2, if symptoms persist, take 1 tablet in the morning and 2 tablets at bedtime on day 3, if symptoms persists, any increased to MAX 4 tablets per day, administrative is 1 tablet in the morning, 1 tablet in the mid afternoon, and 2 tablets at bedtime 04/01/16 04/01/16  Alvira MondayErin Emiyah Spraggins, MD  ibuprofen  (ADVIL,MOTRIN) 400 MG tablet Take 1 tablet (400 mg total) by mouth every 6 (six) hours as needed. Patient not taking: Reported on 02/01/2016 12/24/15   Earley FavorGail Schulz, NP  ketorolac (TORADOL) 10 MG tablet Take 1 tablet (10 mg total) by mouth every 8 (eight) hours as needed. Patient not taking: Reported on 02/01/2016 12/12/15   Darci Currentandolph N Brown, MD  ondansetron (ZOFRAN ODT) 4 MG disintegrating tablet Take 1 tablet (4 mg total) by mouth every 8 (eight) hours as needed for nausea or vomiting. Patient not taking: Reported on 02/01/2016 10/02/15   Rebecka ApleyAllison P Webster, MD  ondansetron (ZOFRAN) 4 MG tablet Take 1 tablet (4 mg total) by mouth every 6 (six) hours. 02/01/16   Joycie PeekBenjamin Cartner, PA-C  phenazopyridine (PYRIDIUM) 100 MG tablet Take 1 tablet (100 mg total) by mouth 3 (three) times daily as needed for pain. Patient not taking: Reported on 02/01/2016 10/02/15   Rebecka ApleyAllison P Webster, MD    Family History No family history on file.  Social History Social History  Substance Use Topics  . Smoking status: Former Games developermoker  . Smokeless tobacco: Not on file  . Alcohol use No     Allergies   Review of patient's allergies indicates no known allergies.   Review of Systems Review of Systems  Constitutional: Negative  for fever.  HENT: Negative for sore throat.   Eyes: Negative for visual disturbance.  Respiratory: Negative for cough and shortness of breath.   Cardiovascular: Negative for chest pain.  Gastrointestinal: Positive for abdominal pain, nausea and vomiting. Negative for constipation and diarrhea.  Genitourinary: Negative for difficulty urinating, vaginal bleeding and vaginal discharge.  Musculoskeletal: Negative for back pain and neck pain.  Skin: Negative for rash.  Neurological: Negative for syncope and headaches.     Physical Exam Updated Vital Signs BP 122/80 (BP Location: Left Arm)   Pulse 84   Temp 97.9 F (36.6 C) (Oral)   Resp 16   Ht 5' (1.524 m)   Wt 170 lb (77.1 kg)   LMP  03/18/2016   SpO2 99%   BMI 33.20 kg/m   Physical Exam  Constitutional: She is oriented to person, place, and time. She appears well-developed and well-nourished. No distress.  HENT:  Head: Normocephalic and atraumatic.  Eyes: Conjunctivae and EOM are normal.  Neck: Normal range of motion.  Cardiovascular: Normal rate, regular rhythm, normal heart sounds and intact distal pulses.  Exam reveals no gallop and no friction rub.   No murmur heard. Pulmonary/Chest: Effort normal and breath sounds normal. No respiratory distress. She has no wheezes. She has no rales.  Abdominal: Soft. She exhibits no distension. There is no tenderness. There is no guarding.  Musculoskeletal: She exhibits no edema or tenderness.  Neurological: She is alert and oriented to person, place, and time.  Skin: Skin is warm and dry. No rash noted. She is not diaphoretic. No erythema.  Nursing note and vitals reviewed.    ED Treatments / Results  Labs (all labs ordered are listed, but only abnormal results are displayed) Labs Reviewed  WET PREP, GENITAL - Abnormal; Notable for the following:       Result Value   Clue Cells Wet Prep HPF POC PRESENT (*)    WBC, Wet Prep HPF POC MANY (*)    All other components within normal limits  COMPREHENSIVE METABOLIC PANEL - Abnormal; Notable for the following:    Calcium 8.7 (*)    All other components within normal limits  CBC - Abnormal; Notable for the following:    MCV 76.5 (*)    MCH 25.4 (*)    All other components within normal limits  URINALYSIS, ROUTINE W REFLEX MICROSCOPIC (NOT AT [facility]) - Abnormal; Notable for the following:    Leukocytes, UA SMALL (*)    All other components within normal limits  HCG, QUANTITATIVE, PREGNANCY - Abnormal; Notable for the following:    hCG, Beta Chain, Quant, S 180 (*)    All other components within normal limits  URINE MICROSCOPIC-ADD ON - Abnormal; Notable for the following:    Squamous Epithelial / LPF 6-30 (*)     Bacteria, UA FEW (*)    All other components within normal limits  I-STAT BETA HCG BLOOD, ED ([facility], [facility], [facility] ONLY) - Abnormal; Notable for the following:    I-stat hCG, quantitative 192.7 (*)    All other components within normal limits  POC URINE PREG, ED - Abnormal; Notable for the following:    Preg Test, Ur POSITIVE (*)    All other components within normal limits  URINE CULTURE  LIPASE, BLOOD  GC/CHLAMYDIA PROBE AMP ([facility]) NOT AT [facility]    EKG  EKG Interpretation None       Radiology US Ob Comp Less 14 Wks  Result Date: 04/01/2016 CLINICAL DATA:  Abdominal pain for 3 days EXAM: OBSTETRIC <14 WK [place] AND TRANSVAGINAL OB US TECHNIQUE: Both transabdominal and transvaginal ultrasound examinations were performed for complete evaluation of the gestation as well as the maternal uterus, adnexal regions, and pelvic cul-de-sac. Transvaginal technique was performed to assess early pregnancy. COMPARISON:  None. FINDINGS: Intrauterine gestational sac: Possible early single gestational sac Yolk sac:  Not visualized Embryo:  Not visualized Cardiac Activity: Not visualized Heart Rate:   bpm MSD: Approximately 1-2 mm in diameter, too small to definitively measure or determine gestational age CRL:    mm    w    d                  [place] EDC: Subchorionic hemorrhage:  None visualized. Maternal uterus/adnexae: Endometrium is thickened, measuring 3 cm. No adnexal masses or free fluid. IMPRESSION: Possible early intrauterine gestational sac without yolk sac or fetal pole. If this is an early gestational sac, it is too small to accurately measure gestational age. Recommend follow-up in 14 days to further evaluate. Thickened endometrium, 3 cm in thickness. Recommend attention on followup imaging. Electronically Signed   By: Charlett Nose M.D.   On: 04/01/2016 14:43   US Ob Transvaginal  Result Date: 04/01/2016 CLINICAL DATA:  Abdominal pain for 3 days EXAM: OBSTETRIC <14 WK [place] AND TRANSVAGINAL OB US TECHNIQUE:  Both transabdominal and transvaginal ultrasound examinations were performed for complete evaluation of the gestation as well as the maternal uterus, adnexal regions, and pelvic cul-de-sac. Transvaginal technique was performed to assess early pregnancy. COMPARISON:  None. FINDINGS: Intrauterine gestational sac: Possible early single gestational sac Yolk sac:  Not visualized Embryo:  Not visualized Cardiac Activity: Not visualized Heart Rate:   bpm MSD: Approximately 1-2 mm in diameter, too small to definitively measure or determine gestational age CRL:    mm    w    d                  [place] EDC: Subchorionic hemorrhage:  None visualized. Maternal uterus/adnexae: Endometrium is thickened, measuring 3 cm. No adnexal masses or free fluid. IMPRESSION: Possible early intrauterine gestational sac without yolk sac or fetal pole. If this is an early gestational sac, it is too small to accurately measure gestational age. Recommend follow-up in 14 days to further evaluate. Thickened endometrium, 3 cm in thickness. Recommend attention on followup imaging. Electronically Signed   By: Charlett Nose M.D.   On: 04/01/2016 14:43    Procedures Procedures (including critical care time)  Medications Ordered in ED Medications  pyridOXINE (B-6) injection 100 mg (100 mg Intravenous Given 04/01/16 1349)  sodium chloride 0.9 % bolus 1,000 mL (0 mLs Intravenous Stopped 04/01/16 1537)     Initial Impression / Assessment and Plan / ED Course  I have reviewed the triage vital signs and the nursing notes.  Pertinent labs & imaging results that were available during my care of the patient were reviewed by me and considered in my medical decision making (see chart for details).  Clinical Course   20 year old female with history of one possible prior pregnancy/miscarriage who presents with concern of lower abdominal pain and nausea. Abdominal exam is overall benign and, and have low suspicion for appendicitis, cholecystitis,  diverticulitis. Patient's pregnancy test is positive. Quantitative hCG is 197. Transvaginal ultrasound shows a gestational sac, however no yolk sac. Patient's nausea is improved with vitamin B6 in the emergency department. Discussed the patient at this is a pregnancy of undetermined location given  no yolk sac on exam, and recommend 48 hour hCG at women's. In addition, recommend 2 week ultrasound. Gave prescription for diclegis. Discussed need for prenatal vitamins, and avoidance of ibuprofen. Provided prenatal information and recommend close OB follow up.  Final Clinical Impressions(s) / ED Diagnoses   Final diagnoses:  Pregnancy of unknown anatomic location, intrauterine gestational sac but no yolk sac seen at this time    New Prescriptions Discharge Medication List as of 04/01/2016  4:01 PM    START taking these medications   Details  Doxylamine-Pyridoxine 10-10 MG TBEC Take 1 tablet by mouth once. Initial, 2 tablets orally at bedtime on day one and 2, if symptoms persist, take 1 tablet in the morning and 2 tablets at bedtime on day 3, if symptoms persists, any increased to MAX 4 tablets per day, administrative is 1 tab let in the morning, 1 tablet in the mid afternoon, and 2 tablets at bedtime, Starting Wed 04/01/2016, Print         Alvira Monday, MD 04/01/16 1733

## 2016-04-01 NOTE — ED Notes (Signed)
Ultrasound at bedside

## 2016-04-01 NOTE — ED Notes (Addendum)
Discharge instructions, follow up care, and nausea rx x1 reviewed with patient. Patient verbalized understanding.

## 2016-04-02 LAB — GC/CHLAMYDIA PROBE AMP (~~LOC~~) NOT AT ARMC
CHLAMYDIA, DNA PROBE: NEGATIVE
NEISSERIA GONORRHEA: NEGATIVE

## 2016-04-02 LAB — URINE CULTURE: Culture: NO GROWTH

## 2016-04-07 ENCOUNTER — Other Ambulatory Visit: Payer: Self-pay

## 2016-04-07 DIAGNOSIS — O3680X0 Pregnancy with inconclusive fetal viability, not applicable or unspecified: Secondary | ICD-10-CM

## 2016-04-08 LAB — HCG, QUANTITATIVE, PREGNANCY: HCG, BETA CHAIN, QUANT, S: 2375.5 m[IU]/mL — AB

## 2016-04-09 ENCOUNTER — Telehealth: Payer: Self-pay

## 2016-04-09 DIAGNOSIS — O3680X0 Pregnancy with inconclusive fetal viability, not applicable or unspecified: Secondary | ICD-10-CM

## 2016-04-09 NOTE — Telephone Encounter (Signed)
Patient called into office requesting test result regarding hcg. Patient results have been given.Patient has been scheduled an ultrasound in 04/2016

## 2016-04-10 ENCOUNTER — Encounter: Payer: Self-pay | Admitting: Family Medicine

## 2016-04-16 ENCOUNTER — Emergency Department: Payer: Medicaid Other

## 2016-04-16 ENCOUNTER — Encounter: Payer: Self-pay | Admitting: Emergency Medicine

## 2016-04-16 ENCOUNTER — Emergency Department
Admission: EM | Admit: 2016-04-16 | Discharge: 2016-04-17 | Disposition: A | Discharge status: Home / Self Care | Payer: Medicaid Other | Attending: Emergency Medicine | Admitting: Emergency Medicine

## 2016-04-16 DIAGNOSIS — O209 Hemorrhage in early pregnancy, unspecified: Secondary | ICD-10-CM | POA: Diagnosis not present

## 2016-04-16 DIAGNOSIS — Z79899 Other long term (current) drug therapy: Secondary | ICD-10-CM | POA: Diagnosis not present

## 2016-04-16 DIAGNOSIS — Z3A01 Less than 8 weeks gestation of pregnancy: Secondary | ICD-10-CM | POA: Diagnosis not present

## 2016-04-16 DIAGNOSIS — Z87891 Personal history of nicotine dependence: Secondary | ICD-10-CM | POA: Insufficient documentation

## 2016-04-16 DIAGNOSIS — O219 Vomiting of pregnancy, unspecified: Secondary | ICD-10-CM | POA: Insufficient documentation

## 2016-04-16 DIAGNOSIS — O2341 Unspecified infection of urinary tract in pregnancy, first trimester: Secondary | ICD-10-CM

## 2016-04-16 DIAGNOSIS — N939 Abnormal uterine and vaginal bleeding, unspecified: Secondary | ICD-10-CM

## 2016-04-16 DIAGNOSIS — Z3491 Encounter for supervision of normal pregnancy, unspecified, first trimester: Secondary | ICD-10-CM

## 2016-04-16 LAB — CBC
HCT: 39.1 % (ref 35.0–47.0)
Hemoglobin: 12.8 g/dL (ref 12.0–16.0)
MCH: 25.5 pg — AB (ref 26.0–34.0)
MCHC: 32.8 g/dL (ref 32.0–36.0)
MCV: 77.6 fL — AB (ref 80.0–100.0)
PLATELETS: 293 10*3/uL (ref 150–440)
RBC: 5.03 MIL/uL (ref 3.80–5.20)
RDW: 16 % — AB (ref 11.5–14.5)
WBC: 12.6 10*3/uL — ABNORMAL HIGH (ref 3.6–11.0)

## 2016-04-16 LAB — BASIC METABOLIC PANEL
ANION GAP: 9 (ref 5–15)
BUN: 9 mg/dL (ref 6–20)
CALCIUM: 9.3 mg/dL (ref 8.9–10.3)
CHLORIDE: 106 mmol/L (ref 101–111)
CO2: 23 mmol/L (ref 22–32)
CREATININE: 0.6 mg/dL (ref 0.44–1.00)
GFR calc Af Amer: >60 mL/min (ref >60)
GFR calc non Af Amer: >60 mL/min (ref >60)
GLUCOSE: 94 mg/dL (ref 65–99)
Potassium: 3.8 mmol/L (ref 3.5–5.1)
Sodium: 138 mmol/L (ref 135–145)

## 2016-04-16 LAB — URINALYSIS COMPLETE WITH MICROSCOPIC (ARMC ONLY)
BILIRUBIN URINE: NEGATIVE
Glucose, UA: NEGATIVE mg/dL
Hgb urine dipstick: NEGATIVE
Nitrite: NEGATIVE
Protein, ur: 30 mg/dL — AB
Specific Gravity, Urine: 1.026 (ref 1.005–1.030)
pH: 6 (ref 5.0–8.0)

## 2016-04-16 LAB — HCG, QUANTITATIVE, PREGNANCY: hCG, Beta Chain, Quant, S: 29488 m[IU]/mL — ABNORMAL HIGH (ref <5)

## 2016-04-16 LAB — ABO/RH: ABO/RH(D): O NEG

## 2016-04-16 LAB — ANTIBODY SCREEN: Antibody Screen: NEGATIVE

## 2016-04-16 MED ORDER — RHO D IMMUNE GLOBULIN 1500 UNIT/2ML IJ SOSY
50.0000 ug | PREFILLED_SYRINGE | Freq: Once | INTRAMUSCULAR | Status: AC
  Administered 2016-04-16: 49.5 ug via INTRAMUSCULAR
  Filled 2016-04-16: qty 2

## 2016-04-16 MED ORDER — DOXYLAMINE-PYRIDOXINE 10-10 MG PO TBEC: DELAYED_RELEASE_TABLET | ORAL | 0 refills | Status: DC

## 2016-04-16 MED ORDER — NITROFURANTOIN MONOHYD MACRO 100 MG PO CAPS: 100.0000 mg | ORAL_CAPSULE | Freq: Two times a day (BID) | ORAL | 0 refills | Status: DC

## 2016-04-16 MED ORDER — NITROFURANTOIN MONOHYD MACRO 100 MG PO CAPS
100.0000 mg | ORAL_CAPSULE | Freq: Once | ORAL | Status: AC
  Administered 2016-04-16: 100 mg via ORAL
  Filled 2016-04-16: qty 1

## 2016-04-16 MED ORDER — RHO D IMMUNE GLOBULIN 1500 UNIT/2ML IJ SOSY
300.0000 ug | PREFILLED_SYRINGE | Freq: Once | INTRAMUSCULAR | Status: DC
Start: 2016-04-16 — End: 2016-04-16
  Filled 2016-04-16: qty 2

## 2016-04-16 NOTE — ED Notes (Signed)
Pt states she is [redacted] weeks pregnant and has been throwing up and unable to keep anything down. Pt has not seen GYN, has ultrasound next Friday.

## 2016-04-16 NOTE — ED Triage Notes (Signed)
Patient ambulatory to triage with steady gait, without difficulty or distress noted; pt reports N/V with pregnancy; some vag spotting; approx 5-[redacted]wks pregnant

## 2016-04-16 NOTE — ED Provider Notes (Signed)
[facility] Emergency Department Provider Note ____________________________________________   I have reviewed the triage vital signs and the triage nursing note.  HISTORY  Chief Complaint Abdominal Pain   Historian Patient  HPI Janet Greer is a 20 y.o. female who is approx 5-6 weeksby her own dates, presents today for lower abdominal cramping, spotting, described as small amount of blood or pink on her toilet paper when she wiped today, and nausea and vomiting for the last few days, making it difficulty injuring. She has not seen an OB/GYN yet. She has not had an ultrasound confirming IUP yet. Symptoms are moderate. Negative for fever or congestion.    History reviewed. No pertinent past medical history.  There are no active problems to display for this patient.   Past Surgical History:  Procedure Laterality Date  . WISDOM TOOTH EXTRACTION     2014    Prior to Admission medications   Medication Sig Start Date End Date Taking? Authorizing Provider  cephALEXin (KEFLEX) 500 MG capsule Take 1 capsule (500 mg total) by mouth 4 (four) times daily. Patient not taking: Reported on 04/01/2016 02/01/16   Joycie Peek, PA-C  cyclobenzaprine (FLEXERIL) 5 MG tablet Take 1 tablet (5 mg total) by mouth 3 (three) times daily. Patient not taking: Reported on 02/01/2016 12/24/15   Earley Favor, NP  Doxylamine-Pyridoxine (DICLEGIS) 10-10 MG TBEC Two tablets at bedtime on day 1 and 2;  if symptoms persist, take 1 tablet in morning and 2 tablets at bedtime on day 3;  if symptoms persist, may increase to 1 tablet in morning, 1 tablet mid-afternoon, and 2 tablets at bedtime on day 4 04/16/16   Governor Rooks, MD  ibuprofen (ADVIL,MOTRIN) 400 MG tablet Take 1 tablet (400 mg total) by mouth every 6 (six) hours as needed. Patient not taking: Reported on 02/01/2016 12/24/15   Earley Favor, NP  ketorolac (TORADOL) 10 MG tablet Take 1 tablet (10 mg total) by mouth every 8 (eight)  hours as needed. Patient not taking: Reported on 02/01/2016 12/12/15   Darci Current, MD  nitrofurantoin, macrocrystal-monohydrate, (MACROBID) 100 MG capsule Take 1 capsule (100 mg total) by mouth 2 (two) times daily. 04/16/16   Governor Rooks, MD  ondansetron (ZOFRAN ODT) 4 MG disintegrating tablet Take 1 tablet (4 mg total) by mouth every 8 (eight) hours as needed for nausea or vomiting. Patient not taking: Reported on 02/01/2016 10/02/15   Rebecka Apley, MD  ondansetron (ZOFRAN) 4 MG tablet Take 1 tablet (4 mg total) by mouth every 6 (six) hours. 02/01/16   Joycie Peek, PA-C  phenazopyridine (PYRIDIUM) 100 MG tablet Take 1 tablet (100 mg total) by mouth 3 (three) times daily as needed for pain. Patient not taking: Reported on 02/01/2016 10/02/15   Rebecka Apley, MD    No Known Allergies  No family history on file.  Social History Social History  Substance Use Topics  . Smoking status: Former Games developer  . Smokeless tobacco: Never Used  . Alcohol use No    Review of Systems  Constitutional: Negative for fever. Eyes: Negative for visual changes. ENT: Negative for sore throat. Cardiovascular: Negative for chest pain. Respiratory: Negative for shortness of breath. Gastrointestinal: Negative for diarrhea. Genitourinary: Negative for dysuria. Musculoskeletal: Negative for back pain. Skin: Negative for rash. Neurological: Negative for headache. 10 point Review of Systems otherwise negative ____________________________________________   PHYSICAL EXAM:  VITAL SIGNS: ED Triage Vitals  Enc Vitals Group     BP 04/16/16 1930 Marland Kitchen)  127/55     Pulse Rate 04/16/16 1930 93     Resp 04/16/16 1930 18     Temp 04/16/16 1930 98.2 F (36.8 C)     Temp Source 04/16/16 1930 Oral     SpO2 04/16/16 1930 99 %     Weight 04/16/16 1929 194 lb (88 kg)     Height 04/16/16 1929 5' (1.524 m)     Head Circumference --      Peak Flow --      Pain Score 04/16/16 1929 7     Pain Loc --       Pain Edu? --      Excl. in GC? --      Constitutional: Alert and oriented. Well appearing and in no distress. HEENT   Head: Normocephalic and atraumatic.      Eyes: Conjunctivae are normal. PERRL. Normal extraocular movements.      Ears:         Nose: No congestion/rhinnorhea.   Mouth/Throat: Mucous membranes are Mildly dry.   Neck: No stridor. Cardiovascular/Chest: Normal rate, regular rhythm.  No murmurs, rubs, or gallops. Respiratory: Normal respiratory effort without tachypnea nor retractions. Breath sounds are clear and equal bilaterally. No wheezes/rales/rhonchi. Gastrointestinal: Soft. No distention, no guarding, no rebound. Nontender.    Genitourinary/rectal: No bleeding. Mild clear discharge. No cervical motion tenderness. Musculoskeletal: Nontender with normal range of motion in all extremities. No joint effusions.  No lower extremity tenderness.  No edema. Neurologic:  Normal speech and language. No gross or focal neurologic deficits are appreciated. Skin:  Skin is warm, dry and intact. No rash noted. Psychiatric: Mood and affect are normal. Speech and behavior are normal. Patient exhibits appropriate insight and judgment.   ____________________________________________  LABS (pertinent positives/negatives)  Labs Reviewed  HCG, QUANTITATIVE, PREGNANCY - Abnormal; Notable for the following:       Result Value   hCG, Beta Chain, Quant, S 29,488 (*)    All other components within normal limits  CBC - Abnormal; Notable for the following:    WBC 12.6 (*)    MCV 77.6 (*)    MCH 25.5 (*)    RDW 16.0 (*)    All other components within normal limits  URINALYSIS COMPLETEWITH MICROSCOPIC ([facility] ONLY) - Abnormal; Notable for the following:    Color, Urine AMBER (*)    APPearance HAZY (*)    Ketones, ur 2+ (*)    Protein, ur 30 (*)    Leukocytes, UA 3+ (*)    Bacteria, UA RARE (*)    Squamous Epithelial / LPF 6-30 (*)    All other components within normal limits   CHLAMYDIA/NGC RT PCR ([facility] ONLY)  URINE CULTURE  BASIC METABOLIC PANEL  URINALYSIS COMPLETEWITH MICROSCOPIC ([facility] ONLY)  ABO/RH  TYPE AND SCREEN  ANTIBODY SCREEN    ____________________________________________    EKG I, Governor Rooks, MD, the attending physician have personally viewed and interpreted all ECGs.  None ____________________________________________  RADIOLOGY All Xrays were viewed by me. Imaging interpreted by Radiologist.  Ultrasound pelvic and transvaginal: FINDINGS: Intrauterine gestational sac: Single intrauterine gestational sac  Yolk sac: Seen  Embryo: Present  Cardiac Activity: Detected  Heart Rate: 123 bpm  CRL: 6 mm  6 w  3 d         [place] EDC: 12/07/2016  Subchorionic hemorrhage: None visualized.  Maternal uterus/adnexae: The maternal ovaries appear unremarkable. Probable 2.4 x 1.3 x 1.5 cm right ovarian corpus luteum. Small free fluid noted within the  pelvis.  IMPRESSION: Single live intrauterine pregnancy with an estimated gestational age of [redacted] weeks, 3 days based on today's crown-rump length. __________________________________________  PROCEDURES  Procedure(s) performed: None  Critical Care performed: None  ____________________________________________   ED COURSE / ASSESSMENT AND PLAN  Pertinent labs & imaging results that were available during my care of the patient were reviewed by me and considered in my medical decision making (see chart for details).  Ms. Chilton SiGreen is here early in pregnancy complaining of vaginal cramping, spotting, and nausea and vomiting. She is overall well appearing but slightly dry mucous members. I gave her IV fluids here and she has had no additional emesis. I discussed with her prescribing Ticlid us and small frequent high-protein meals.  In terms of abdominal discomfort, no evidence of PID on exam, soft abdomen, and reassuring laboratory studies and urinalysis.  Gonorrhea and  chlamydia sent, not back upon discharge.  Ultrasound shows IUP, discussed results with patient.  Due to the concern about spotting although she does not have any obvious bleeding here, she is less than [redacted] weeks pregnant and I am going to give her the micro-dose of the rhogam, 50 g.  UA concerning for possible uti, culture sent, will give macrobid.  CONSULTATIONS:   None   Patient / Family / Caregiver informed of clinical course, medical decision-making process, and agree with plan.   I discussed return precautions, follow-up instructions, and discharge instructions with patient and/or family.   ___________________________________________   FINAL CLINICAL IMPRESSION(S) / ED DIAGNOSES   Final diagnoses:  Vaginal spotting  First trimester pregnancy  Nausea and vomiting in pregnancy prior to [redacted] weeks gestation  UTI (urinary tract infection) during pregnancy, first trimester              Note: This dictation was prepared with Office managerDragon dictation. Any transcriptional errors that result from this process are unintentional    Governor Rooksebecca Treven Holtman, MD 04/16/16 2222

## 2016-04-16 NOTE — Discharge Instructions (Signed)
You were evaluated for nausea and vomiting in early pregnancy and are being prescribed nausea medicine called diclegis.  Eat small frequent meals with high protein.  Follow up with ob gyn.  You were given "mini" dose of rho-gam for Rh negative and concern about spotting/bleeding.    Return to the ER for any worsening condition including any worsening pain, heavy vaginal bleeding, or concern about dehydration due to persistent nausea and vomiting and unable to tolerate fluids including not making urine or certainly any fevers.

## 2016-04-16 NOTE — ED Notes (Signed)
Lab called to advise that an insufficient sample was sent down for the ordered UA. Sample needs to be recollected. Triage tech Misty Stanley(Lisa) made aware.

## 2016-04-17 LAB — RHOGAM INJECTION: UNIT DIVISION: 0

## 2016-04-17 LAB — CHLAMYDIA/NGC RT PCR (ARMC ONLY)
CHLAMYDIA TR: NOT DETECTED
N gonorrhoeae: NOT DETECTED

## 2016-04-18 LAB — URINE CULTURE: CULTURE: NO GROWTH

## 2016-04-24 ENCOUNTER — Encounter (HOSPITAL_COMMUNITY): Payer: Self-pay

## 2016-04-24 ENCOUNTER — Ambulatory Visit (HOSPITAL_COMMUNITY): Admission: RE | Admit: 2016-04-24 | Payer: Self-pay | Source: Ambulatory Visit

## 2016-04-24 NOTE — Progress Notes (Signed)
Received a call from [facility] [place], Radiology, and she wanted to know if patient still needed to have her US scheduled for today due to her already having an US that showed viability on 04/16/16 from [place] [facility].  I advised that she could cancel her US appt.  To make sure that pt contacts an OB/GYN office to start prenatal care.  Pt then requests to have pictures, I advised to have pt contact [facility] to see if there was any way to get her pictures.  Zollie ScaleOlivia stated that she would inform pt of recommendations.

## 2016-05-21 LAB — OB RESULTS CONSOLE HEPATITIS B SURFACE ANTIGEN: HEP B S AG: NEGATIVE

## 2016-05-21 LAB — OB RESULTS CONSOLE VARICELLA ZOSTER ANTIBODY, IGG: VARICELLA IGG: IMMUNE

## 2016-05-21 LAB — OB RESULTS CONSOLE RPR: RPR: NONREACTIVE

## 2016-05-21 LAB — OB RESULTS CONSOLE HIV ANTIBODY (ROUTINE TESTING): HIV: NONREACTIVE

## 2016-05-21 LAB — OB RESULTS CONSOLE RUBELLA ANTIBODY, IGM: Rubella: NON-IMMUNE/NOT IMMUNE

## 2016-06-25 ENCOUNTER — Emergency Department
Admission: EM | Admit: 2016-06-25 | Discharge: 2016-06-25 | Disposition: A | Discharge status: Home / Self Care | Payer: Medicaid Other | Attending: Emergency Medicine | Admitting: Emergency Medicine

## 2016-06-25 ENCOUNTER — Encounter: Payer: Self-pay | Admitting: Emergency Medicine

## 2016-06-25 ENCOUNTER — Emergency Department: Payer: Medicaid Other

## 2016-06-25 DIAGNOSIS — O2342 Unspecified infection of urinary tract in pregnancy, second trimester: Secondary | ICD-10-CM | POA: Diagnosis not present

## 2016-06-25 DIAGNOSIS — R1031 Right lower quadrant pain: Secondary | ICD-10-CM

## 2016-06-25 DIAGNOSIS — O26892 Other specified pregnancy related conditions, second trimester: Secondary | ICD-10-CM | POA: Diagnosis present

## 2016-06-25 DIAGNOSIS — Z87891 Personal history of nicotine dependence: Secondary | ICD-10-CM | POA: Insufficient documentation

## 2016-06-25 DIAGNOSIS — Z3A16 16 weeks gestation of pregnancy: Secondary | ICD-10-CM | POA: Insufficient documentation

## 2016-06-25 DIAGNOSIS — N898 Other specified noninflammatory disorders of vagina: Secondary | ICD-10-CM | POA: Diagnosis not present

## 2016-06-25 LAB — CBC
HEMATOCRIT: 36 % (ref 35.0–47.0)
HEMOGLOBIN: 12.4 g/dL (ref 12.0–16.0)
MCH: 27.2 pg (ref 26.0–34.0)
MCHC: 34.5 g/dL (ref 32.0–36.0)
MCV: 78.8 fL — AB (ref 80.0–100.0)
Platelets: 245 10*3/uL (ref 150–440)
RBC: 4.56 MIL/uL (ref 3.80–5.20)
RDW: 15.7 % — ABNORMAL HIGH (ref 11.5–14.5)
WBC: 10.2 10*3/uL (ref 3.6–11.0)

## 2016-06-25 LAB — COMPREHENSIVE METABOLIC PANEL
ALBUMIN: 3.5 g/dL (ref 3.5–5.0)
ALK PHOS: 53 U/L (ref 38–126)
ALT: 23 U/L (ref 14–54)
ANION GAP: 7 (ref 5–15)
AST: 20 U/L (ref 15–41)
BUN: 8 mg/dL (ref 6–20)
CALCIUM: 9.2 mg/dL (ref 8.9–10.3)
CHLORIDE: 106 mmol/L (ref 101–111)
CO2: 23 mmol/L (ref 22–32)
Creatinine, Ser: 0.56 mg/dL (ref 0.44–1.00)
GFR calc Af Amer: >60 mL/min (ref >60)
GFR calc non Af Amer: >60 mL/min (ref >60)
GLUCOSE: 87 mg/dL (ref 65–99)
Potassium: 4.2 mmol/L (ref 3.5–5.1)
SODIUM: 136 mmol/L (ref 135–145)
Total Bilirubin: 0.5 mg/dL (ref 0.3–1.2)
Total Protein: 7.4 g/dL (ref 6.5–8.1)

## 2016-06-25 LAB — URINALYSIS, COMPLETE (UACMP) WITH MICROSCOPIC
Bilirubin Urine: NEGATIVE
Glucose, UA: NEGATIVE mg/dL
Hgb urine dipstick: NEGATIVE
KETONES UR: NEGATIVE mg/dL
Nitrite: NEGATIVE
PROTEIN: NEGATIVE mg/dL
RBC / HPF: NONE SEEN RBC/hpf (ref 0–5)
Specific Gravity, Urine: 1.02 (ref 1.005–1.030)
pH: 6 (ref 5.0–8.0)

## 2016-06-25 LAB — HCG, QUANTITATIVE, PREGNANCY: HCG, BETA CHAIN, QUANT, S: 36695 m[IU]/mL — AB (ref <5)

## 2016-06-25 LAB — LIPASE, BLOOD: LIPASE: 33 U/L (ref 11–51)

## 2016-06-25 MED ORDER — CEPHALEXIN 500 MG PO CAPS
500.0000 mg | ORAL_CAPSULE | Freq: Once | ORAL | Status: AC
  Administered 2016-06-25: 500 mg via ORAL
  Filled 2016-06-25: qty 1

## 2016-06-25 MED ORDER — PRENATAL VITAMINS 0.8 MG PO TABS: 1.0000 | ORAL_TABLET | Freq: Every day | ORAL | 0 refills | Status: DC

## 2016-06-25 MED ORDER — CEPHALEXIN 500 MG PO CAPS: 500.0000 mg | ORAL_CAPSULE | Freq: Two times a day (BID) | ORAL | 0 refills | Status: DC

## 2016-06-25 NOTE — ED Triage Notes (Signed)
Pt to ED c/o lower abd pain since yesterday.  States cramping, n/v, loss of appetite.  Pt is 16 weeks 3 days pregnant, first pregnancy.

## 2016-06-25 NOTE — ED Notes (Signed)
Pt returned from MRI at this time

## 2016-06-25 NOTE — ED Provider Notes (Signed)
[facility] Emergency Department Provider Note  ____________________________________________   First MD Initiated Contact with Patient 06/25/16 1504     (approximate)  I have reviewed the triage vital signs and the nursing notes.   HISTORY  Chief Complaint Abdominal Pain   HPI Janet Greer is a 20 y.o. female who is a G1 P0 at [redacted] weeks gestation was presenting with lower abdominal pain concentrated in the right lower quadrant. Says the pain has been ongoing for one day and lasts for about 25 minutes at a time and is associated with nausea and vomiting. She says that she has a vaginal discharge but this is typical for her and is unchanged. She says that she is a [facility] and that she is not taking any prenatal vitamins [facility] who recommended that she reported to the emergency department for further workup. She also denies any vaginal bleeding. Said the pain is sharp and worsens the point that it almost makes her cry. She is denying any pain at this time.   History reviewed. No pertinent past medical history.  There are no active problems to display for this patient.   Past Surgical History:  Procedure Laterality Date  . WISDOM TOOTH EXTRACTION     2014    Prior to Admission medications   Medication Sig Start Date End Date Taking? Authorizing Provider  Doxylamine-Pyridoxine (DICLEGIS) 10-10 MG TBEC Two tablets at bedtime on day 1 and 2;  if symptoms persist, take 1 tablet in morning and 2 tablets at bedtime on day 3;  if symptoms persist, may increase to 1 tablet in morning, 1 tablet mid-afternoon, and 2 tablets at bedtime on day 4 04/16/16  Yes Governor Rooksebecca Lord, MD  cyclobenzaprine (FLEXERIL) 5 MG tablet Take 1 tablet (5 mg total) by mouth 3 (three) times daily. Patient not taking: Reported on 06/25/2016 12/24/15   Earley FavorGail Schulz, NP  ondansetron (ZOFRAN) 4 MG tablet Take 1 tablet (4 mg total) by  mouth every 6 (six) hours. Patient not taking: Reported on 06/25/2016 02/01/16   Joycie PeekBenjamin Cartner, PA-C    Allergies Patient has no known allergies.  History reviewed. No pertinent family history.  Social History Social History  Substance Use Topics  . Smoking status: Former Games developermoker  . Smokeless tobacco: Never Used  . Alcohol use No    Review of Systems Constitutional: No fever/chills Eyes: No visual changes. ENT: No sore throat. Cardiovascular: Denies chest pain. Respiratory: Denies shortness of breath. Gastrointestinal: No diarrhea.  No constipation. Genitourinary: Negative for dysuria. Musculoskeletal: Negative for back pain. Skin: Negative for rash. Neurological: Negative for headaches, focal weakness or numbness.  10-point ROS otherwise negative.  ____________________________________________   PHYSICAL EXAM:  VITAL SIGNS: ED Triage Vitals  Enc Vitals Group     BP 06/25/16 1117 121/66     Pulse Rate 06/25/16 1117 91     Resp 06/25/16 1117 16     Temp 06/25/16 1117 98.2 F (36.8 C)     Temp Source 06/25/16 1117 Oral     SpO2 06/25/16 1117 98 %     Weight 06/25/16 1117 194 lb (88 kg)     Height 06/25/16 1117 5' (1.524 m)     Head Circumference --      Peak Flow --      Pain Score 06/25/16 1118 7     Pain Loc --      Pain Edu? --  Excl. in GC? --     Constitutional: Alert and oriented. Well appearing and in no acute distress. Eyes: Conjunctivae are normal. PERRL. EOMI. Head: Atraumatic. Nose: No congestion/rhinnorhea. Mouth/Throat: Mucous membranes are moist.  Oropharynx non-erythematous. Neck: No stridor.   Cardiovascular: Normal rate, regular rhythm. Grossly normal heart sounds.  Good peripheral circulation. Respiratory: Normal respiratory effort.  No retractions. Lungs CTAB. Gastrointestinal: Soft With mild to moderate tenderness over McBurney's point. No upper or left lower quadrant abdominal tenderness. Minimal suprapubic tenderness palpation. no  abdominal bruits. No CVA tenderness. Musculoskeletal: No lower extremity tenderness nor edema.  No joint effusions. Neurologic:  Normal speech and language. No gross focal neurologic deficits are appreciated.  Skin:  Skin is warm, dry and intact. No rash noted. Psychiatric: Mood and affect are normal. Speech and behavior are normal.  ____________________________________________   LABS (all labs ordered are listed, but only abnormal results are displayed)  Labs Reviewed  CBC - Abnormal; Notable for the following:       Result Value   MCV 78.8 (*)    RDW 15.7 (*)    All other components within normal limits  URINALYSIS, COMPLETE (UACMP) WITH MICROSCOPIC - Abnormal; Notable for the following:    Color, Urine YELLOW (*)    APPearance HAZY (*)    Leukocytes, UA LARGE (*)    Bacteria, UA RARE (*)    Squamous Epithelial / LPF 6-30 (*)    All other components within normal limits  HCG, QUANTITATIVE, PREGNANCY - Abnormal; Notable for the following:    hCG, Beta Chain, Quant, [place] [id number] (*)    All other components within normal limits  URINE CULTURE  LIPASE, BLOOD  COMPREHENSIVE METABOLIC PANEL   ____________________________________________  EKG   ____________________________________________  RADIOLOGY  MR PELVIS WO CONTRAST (Final result)  Result time 06/25/16 17:29:43  Final result by Kennith CenterEric Mansell, MD (06/25/16 17:29:43)           Narrative:   CLINICAL DATA: Abdominal pain since yesterday with cramping and nausea/ vomiting. Sixteen weeks pregnant. Evaluate for appendicitis.  EXAM: MRI ABDOMEN WITHOUT CONTRAST  TECHNIQUE: Multiplanar multisequence MR imaging was performed without the administration of intravenous contrast.  COMPARISON: None.  FINDINGS: Lower chest: Unremarkable.  Hepatobiliary: No focal abnormality identified in the liver. There is no evidence for gallstones, gallbladder wall thickening, or pericholecystic fluid. No intrahepatic or  extrahepatic biliary dilation.  Pancreas: No focal mass lesion. No dilatation of the main duct. No intraparenchymal cyst. No peripancreatic edema.  Spleen: No splenomegaly. No focal mass lesion.  Adrenals/Urinary Tract: No adrenal nodule or mass. Kidneys are unremarkable. No hydronephrosis. Visualized portions of the urinary bladder are unremarkable.  Stomach/Bowel: Stomach is nondistended. No gastric wall thickening. No evidence of outlet obstruction. Duodenum is normally positioned as is the ligament of Treitz. No small bowel wall thickening. No small bowel dilatation. The terminal ileum is normal. The appendix is not visualized, but there is no edema or inflammation in the region of the cecum. No gross colonic mass. No colonic wall thickening. No substantial diverticular change.  Vascular/Lymphatic: No abdominal aortic aneurysm. No abdominal lymphadenopathy. No evidence for pelvic lymphadenopathy.  Reproductive: Intrauterine gestation identified. The ovaries are normal in appearance bilaterally.  Other: No evidence for intraperitoneal free fluid although the entire cul-de-sac is not been included on this study.  Musculoskeletal: No abnormal marrow signal within the visualized bony anatomy.  IMPRESSION: Unremarkable MRI of the abdomen and pelvis. Specifically, no features to raise concern for acute appendicitis.  Electronically Signed By: Kennith Center M.D. On: 06/25/2016 17:29          ____________________________________________   PROCEDURES  Procedure(s) performed:   Procedures  Critical Care performed:   ____________________________________________   INITIAL IMPRESSION / ASSESSMENT AND PLAN / ED COURSE  Pertinent labs & imaging results that were available during my care of the patient were reviewed by me and considered in my medical decision making (see chart for details).    Clinical Course     ----------------------------------------- 5:56 PM on 06/25/2016 -----------------------------------------  Patient is resting comfortable at this time. Will be discharged home. Will follow-up with her clinic. We'll also treat for UTI. Likely round ligament pain related to the second trimester pregnancy. Discussed with the patient as well as her family who is at bedside. She is understanding and willing to comply with the plan.  ____________________________________________   FINAL CLINICAL IMPRESSION(S) / ED DIAGNOSES  Final diagnoses:  Right lower quadrant abdominal pain   UTI.   NEW MEDICATIONS STARTED DURING THIS VISIT:  New Prescriptions   No medications on file     Note:  This document was prepared using Dragon voice recognition software and may include unintentional dictation errors.    Myrna Blazer, MD 06/25/16 [phone]

## 2016-06-25 NOTE — ED Notes (Signed)
This RN to bedside at this time to introduce self to patient. Pt is alert and oriented at this time. Pt requesting a warm blanket, this RN explained delay to critical patients in other rooms, pt states understanding at this time. Pt is visualized in NAD at this time.

## 2016-06-25 NOTE — ED Notes (Signed)
This RN to bedside, pt in bed with her significant other at bedside. This RN explained and apologized for delay. Pt states understanding at this time. Pt visualized in no acute distress. Will continue to monitor for further patient needs.

## 2016-06-25 NOTE — ED Notes (Signed)
Pt transported to MRI by this RN  ?

## 2016-06-25 NOTE — ED Notes (Signed)
NAD noted at time of D/C. Pt denies questions or concerns. Pt ambulatory to the lobby at this time.  

## 2016-06-26 LAB — URINE CULTURE

## 2016-07-20 NOTE — L&D Delivery Note (Signed)
Delivery Note At 8:12 AM a viable and healthy female "Janet Greer" was delivered via Vaginal, Spontaneous Delivery (Presentation: LOA;  ).  APGAR:8 ,9 ; weight 3280g .   Placenta status: spontaneous, intact. Cord: 3vc  with the following complications: none.  Cord pH: unable to be collected  Anesthesia:  Epidural Episiotomy:  none Lacerations:  none Suture Repair: n/a Est. Blood Loss (mL):  300  Mom to postpartum.  Baby to Couplet care / Skin to Skin.  20yo G1P0 at 40+0wks presented with PROM, but contractions started soon after. Pitocin augmentation, with Cat II strip overnight, periods of min var and decels with recovery to baseline and moderate variability. Meconium stained fluid overnight. Pt did not receive consistent pain control with epidural and received several re-dosing boluses. She developed maternal tachycardia, fetal tachy and maternal temp overnight, and was started on chorio abx amp/gent. Tylenol helped reduce fever but baby still tachy. Good cervical change without augmentation and she entered the second stage of labor, with involuntary pushing. Maternal efforts excellent, poor pain control with mom asking that we not check her, touch her or talk to her towards the end. Nonetheless, she pushed effectively and well, and delivered the baby's head in an LOA position without a nuchal cord. Prompt delivery of fetal body and a very short umbilical cord, and baby placed on maternal abdomen. FOB cut cord and baby stimulated with vigorous crying - no resuscitation required for excellent apgars. However, cord blood gases attempted and unsuccessful. Placenta will be sent. Abx stopped but will monitor WBC and temp.  Christeen DouglasBethany Clorinda Wyble 12/07/2016, 8:34 AM

## 2016-11-16 LAB — OB RESULTS CONSOLE GC/CHLAMYDIA
Chlamydia: NEGATIVE
Gonorrhea: NEGATIVE

## 2016-11-16 LAB — OB RESULTS CONSOLE GBS: GBS: NEGATIVE

## 2016-12-05 ENCOUNTER — Inpatient Hospital Stay
Admission: EM | Admit: 2016-12-05 | Discharge: 2016-12-05 | Disposition: A | Discharge status: Home / Self Care | Payer: Medicaid Other | Source: Home / Self Care | Admitting: Obstetrics and Gynecology

## 2016-12-05 ENCOUNTER — Encounter: Payer: Self-pay | Admitting: *Deleted

## 2016-12-05 HISTORY — DX: Other specified postprocedural states: Z98.890

## 2016-12-05 HISTORY — DX: Nausea with vomiting, unspecified: R11.2

## 2016-12-05 HISTORY — DX: Adverse effect of unspecified anesthetic, initial encounter: T41.45XA

## 2016-12-05 HISTORY — DX: Other complications of anesthesia, initial encounter: T88.59XA

## 2016-12-05 NOTE — OB Triage Note (Signed)
Ctx started yesterday after Dr. Elesa MassedWard stripped membranes. Felt the need to come to the hospital with ctx @ approx 0830. Denies LOF. Denies bleeding. Elaina HoopsElks, Kanitra Purifoy S

## 2016-12-05 NOTE — Discharge Instructions (Signed)

## 2016-12-05 NOTE — Discharge Summary (Signed)
Obstetric Discharge Summary   Patient ID: Janet Greer MRN: [id number] DOB/AGE: 22/10/1995 22 y.o.  Janet Greer presents today for "labor signs and symptoms including mucus plug expelling and some UC's today", Pt also has not felt baby since yest. No VB. LMP was 02/06/16 with EDD of 11/12/16 and [facility]  with EDD of 12/07/16.   Date of Admission: 12/05/2016  Date of Discharge: 12/05/16  Admitting Diagnosis: IUP at 1427w5d  Secondary Diagnosis: Decreased FM, Mucus plug, labor contractions  Mode of Delivery:None     Discharge Diagnosis: Term pregnancy with B-H's, NST reactive with 2 accels 15 x 15 BPM   Intrapartum Procedures: None   Post partum procedures: None  Complications: none   Brief Hospital Course  Janet Greer is a G1P0 who presents with S/S of labor which was ruled out. Cx unchanged since yest 1/70%/vtx-2; NST reactive with 2 accels 15 x 15 BPM.  By time of discharge, pt was not having any UC's and no ROM, +active fetus, no VB .  She was deemed stable for discharge to home.  STrict [facility]'s    Labs: CBC Latest Ref Rng & Units 06/25/2016 04/16/2016 04/01/2016  WBC 3.6 - 11.0 K/uL 10.2 12.6(H) 8.8  Hemoglobin 12.0 - 16.0 g/dL 95.612.4 21.312.8 08.612.2  Hematocrit 35.0 - 47.0 % 36.0 39.1 36.8  Platelets 150 - 440 K/uL 245 293 312   O NEG  Physical exam:  Blood pressure 129/77, pulse (!) 106, temperature 97.4 F (36.3 C), temperature source Oral, resp. rate 18, height 5' (1.524 m), weight 212 lb (96.2 kg), last menstrual period 03/18/2016. General: alert and no distress HEENT: Normocepahlic, Eyes non-icteric. Heart: S1S2, RRR, No M/R/G. Lungs: CTA bilat, no W/R/R. Abdomen:Gravid Extremities: No evidence of DVT seen on physical exam. No lower extremity edema.  Discharge Instructions: Per After Visit Summary. Activity: Advance as tolerated..  Diet: Regular Medications: Allergies as of 12/05/2016   No Known Allergies     Medication List    STOP taking  these medications   cephALEXin 500 MG capsule Commonly known as:  KEFLEX   cyclobenzaprine 5 MG tablet Commonly known as:  FLEXERIL   Doxylamine-Pyridoxine 10-10 MG Tbec Commonly known as:  DICLEGIS   ondansetron 4 MG tablet Commonly known as:  ZOFRAN     TAKE these medications   Prenatal Vitamins 0.8 MG tablet Take 1 tablet by mouth daily.      Outpatient follow up: 1 week  Discharged Condition: Stable   Discharged to: Home   Barbaraann RondoCaron W Mullica Hill[place], [place] 12/05/2016

## 2016-12-06 ENCOUNTER — Inpatient Hospital Stay
Admission: EM | Admit: 2016-12-06 | Discharge: 2016-12-10 | DRG: 775 | Disposition: A | Discharge status: Home / Self Care | Payer: Medicaid Other | Attending: Obstetrics and Gynecology | Admitting: Obstetrics and Gynecology

## 2016-12-06 DIAGNOSIS — O4202 Full-term premature rupture of membranes, onset of labor within 24 hours of rupture: Secondary | ICD-10-CM | POA: Diagnosis present

## 2016-12-06 DIAGNOSIS — O411230 Chorioamnionitis, third trimester, not applicable or unspecified: Secondary | ICD-10-CM | POA: Diagnosis present

## 2016-12-06 DIAGNOSIS — O26893 Other specified pregnancy related conditions, third trimester: Secondary | ICD-10-CM | POA: Diagnosis present

## 2016-12-06 DIAGNOSIS — O368130 Decreased fetal movements, third trimester, not applicable or unspecified: Secondary | ICD-10-CM | POA: Diagnosis present

## 2016-12-06 DIAGNOSIS — Z3A39 39 weeks gestation of pregnancy: Secondary | ICD-10-CM | POA: Diagnosis not present

## 2016-12-06 DIAGNOSIS — Z6791 Unspecified blood type, Rh negative: Secondary | ICD-10-CM | POA: Diagnosis not present

## 2016-12-06 LAB — TYPE AND SCREEN
ABO/RH(D): O NEG
Antibody Screen: NEGATIVE

## 2016-12-06 LAB — CBC
HCT: 32.9 % — ABNORMAL LOW (ref 35.0–47.0)
HEMOGLOBIN: 11 g/dL — AB (ref 12.0–16.0)
MCH: 25.5 pg — AB (ref 26.0–34.0)
MCHC: 33.5 g/dL (ref 32.0–36.0)
MCV: 76.2 fL — AB (ref 80.0–100.0)
Platelets: 254 10*3/uL (ref 150–440)
RBC: 4.32 MIL/uL (ref 3.80–5.20)
RDW: 16.3 % — ABNORMAL HIGH (ref 11.5–14.5)
WBC: 16 10*3/uL — AB (ref 3.6–11.0)

## 2016-12-06 LAB — RAPID HIV SCREEN (HIV 1/2 AB+AG)
HIV 1/2 ANTIBODIES: NONREACTIVE
HIV-1 P24 ANTIGEN - HIV24: NONREACTIVE

## 2016-12-06 LAB — CHLAMYDIA/NGC RT PCR (ARMC ONLY)
CHLAMYDIA TR: NOT DETECTED
N gonorrhoeae: NOT DETECTED

## 2016-12-06 MED ORDER — OXYCODONE-ACETAMINOPHEN 5-325 MG PO TABS: 2.0000 | ORAL_TABLET | ORAL | Status: DC | PRN

## 2016-12-06 MED ORDER — OXYTOCIN 40 UNITS IN LACTATED RINGERS INFUSION - SIMPLE MED
1.0000 m[IU]/min | INTRAVENOUS | Status: DC
  Administered 2016-12-06: 2 m[IU]/min via INTRAVENOUS
  Filled 2016-12-06: qty 1000

## 2016-12-06 MED ORDER — ACETAMINOPHEN 325 MG PO TABS
650.0000 mg | ORAL_TABLET | ORAL | Status: DC | PRN
  Filled 2016-12-06: qty 2

## 2016-12-06 MED ORDER — ONDANSETRON HCL 4 MG/2ML IJ SOLN
4.0000 mg | Freq: Four times a day (QID) | INTRAMUSCULAR | Status: DC | PRN
  Administered 2016-12-06 – 2016-12-07 (×2): 4 mg via INTRAVENOUS
  Filled 2016-12-06 (×2): qty 2

## 2016-12-06 MED ORDER — LIDOCAINE HCL (PF) 1 % IJ SOLN: 30.0000 mL | INTRAMUSCULAR | Status: DC | PRN

## 2016-12-06 MED ORDER — LACTATED RINGERS IV SOLN: 500.0000 mL | INTRAVENOUS | Status: DC | PRN

## 2016-12-06 MED ORDER — SOD CITRATE-CITRIC ACID 500-334 MG/5ML PO SOLN
30.0000 mL | ORAL | Status: DC | PRN
  Filled 2016-12-06 (×2): qty 30

## 2016-12-06 MED ORDER — OXYTOCIN BOLUS FROM INFUSION: 500.0000 mL | Freq: Once | INTRAVENOUS | Status: DC

## 2016-12-06 MED ORDER — OXYTOCIN 10 UNIT/ML IJ SOLN
INTRAMUSCULAR | Status: AC
  Filled 2016-12-06: qty 2

## 2016-12-06 MED ORDER — MISOPROSTOL 200 MCG PO TABS
ORAL_TABLET | ORAL | Status: AC
  Filled 2016-12-06: qty 4

## 2016-12-06 MED ORDER — TERBUTALINE SULFATE 1 MG/ML IJ SOLN: 0.2500 mg | Freq: Once | INTRAMUSCULAR | Status: DC | PRN

## 2016-12-06 MED ORDER — OXYTOCIN 40 UNITS IN LACTATED RINGERS INFUSION - SIMPLE MED
2.5000 [IU]/h | INTRAVENOUS | Status: DC
  Filled 2016-12-06: qty 1000

## 2016-12-06 MED ORDER — LACTATED RINGERS IV SOLN
INTRAVENOUS | Status: DC
  Administered 2016-12-06: 17:00:00 via INTRAVENOUS

## 2016-12-06 MED ORDER — FENTANYL 2.5 MCG/ML W/ROPIVACAINE 0.2% IN NS 100 ML EPIDURAL INFUSION (ARMC-ANES)
EPIDURAL | Status: AC
  Filled 2016-12-06: qty 100

## 2016-12-06 MED ORDER — AMMONIA AROMATIC IN INHA
RESPIRATORY_TRACT | Status: AC
  Filled 2016-12-06: qty 10

## 2016-12-06 MED ORDER — BUTORPHANOL TARTRATE 1 MG/ML IJ SOLN
1.0000 mg | INTRAMUSCULAR | Status: DC | PRN
  Administered 2016-12-06 (×2): 1 mg via INTRAVENOUS
  Filled 2016-12-06: qty 2

## 2016-12-06 MED ORDER — OXYCODONE-ACETAMINOPHEN 5-325 MG PO TABS
1.0000 | ORAL_TABLET | ORAL | Status: DC | PRN
  Filled 2016-12-06: qty 1

## 2016-12-06 MED ORDER — LIDOCAINE HCL (PF) 1 % IJ SOLN
INTRAMUSCULAR | Status: AC
  Filled 2016-12-06: qty 30

## 2016-12-06 NOTE — H&P (Signed)
Nyajah Sissy HoffMarie Yeaman is a 21 y.o. female presenting for SROM for clear fluid and contractions.  21 y.o. G1P0 at 3061w0d by LMP Sex of baby and name: BOY  Factors complicating this pregnancy   Rubella Non immune  RH negative  ALT elevated in early pregnancy -- resolved as of 30wks  Screening results and needs:  NOB:   MBT O- Ab screen Negative Pap HIV Negative Hep B/RPR Negative/Non reactive  Rubella Non Immune VZV Immune Early P:C ratio: 92  Aneuploidy:   First trimester: (Did they do cffDNA or NT/blood draw?) Declined  Second trimester (AFP/tetra):Negative Screening  28 weeks:   Blood consent: Signed 09/17/16  Hgb: 10.5 Glucola:84 Rhogam: Given 09/17/16 Antibody screen: Positive (Anti D) due to Rhogam shot.  36 weeks:   GBS: Negative G/C:Neg/Neg Hgb: 10.5 RPR: Non-Reactive  Last US: 07/16/16 Single, Viable IUP, S=D, FHT=156bpm, Bilat OVs Imaged, CX closed= 4.6cm Sex: Female   10/22/16-vertex, ant plac, AFI-14.3 cm, EFW-2206 g @43 %  Immunization:   Flu in season - 06/18/16 declined  Tdap at 27-36 weeks - Given 09/17/16  Social: no changes  OB History    Gravida Para Term Preterm AB Living   1             SAB TAB Ectopic Multiple Live Births                 Past Medical History:  Diagnosis Date  . Complication of anesthesia   . PONV (postoperative nausea and vomiting)    Past Surgical History:  Procedure Laterality Date  . WISDOM TOOTH EXTRACTION     2014   Family History: family history is not on file. Social History:  reports that she has never smoked. She has never used smokeless tobacco. She reports that she does not drink alcohol or use drugs.     Maternal Diabetes: No Genetic Screening: Normal Maternal Ultrasounds/Referrals: Normal Fetal Ultrasounds or other Referrals:  None Maternal Substance Abuse:  No Significant Maternal Medications:  None Significant Maternal Lab Results:  None Other Comments:  None  ROS History Dilation: 2.5 Effacement  (%): 50 Station: -2 Exam by:: NB Blood pressure 115/86, pulse 99, temperature 97.6 F (36.4 C), temperature source Oral, resp. rate 18, height 5' (1.524 m), weight 212 lb (96.2 kg), last menstrual period 03/18/2016. Exam Physical Exam  Prenatal labs: ABO, Rh: --/--/O NEG (05/20 1654) Antibody: NEG (05/20 1654) Rubella: Nonimmune (11/02 0000) RPR: Nonreactive (11/02 0000)  HBsAg: Negative (11/02 0000)  HIV: Non-reactive (11/02 0000)  GBS: Negative (04/30 0000)   Assessment/Plan: Last US 10/22/16-vertex, ant plac, AFI-14.3 cm, EFW-2206 g @43 % normal anatomy  ASSESSMENT AND PLAN:  Admit for active labor Labs pending Epidural when desired Continuous fetal monitoring   1. Fetal Well being  - Fetal Tracing: Cat I - Ultrasound:  reviewed, as above - Group B Streptococcus: neg - Presentation: vtx confirmed by sutures   2. Routine OB: - Prenatal labs reviewed, as above - Rh neg  3. Induction of Labor:  -  Contractions external toco in place -  Plan for augmentation with pitocin   Christeen DouglasBethany Nicklas Mcsweeney 12/06/2016, 7:59 PM

## 2016-12-06 NOTE — H&P (Signed)
Janet Sissy HoffMarie Greer is a 21 y.o. female presenting for SROM for clear at 1600 today, intermittent contractions.  - Rubella non immune - Rh neg  OB History    Gravida Para Term Preterm AB Living   1             SAB TAB Ectopic Multiple Live Births                 Past Medical History:  Diagnosis Date  . Complication of anesthesia   . PONV (postoperative nausea and vomiting)    Past Surgical History:  Procedure Laterality Date  . WISDOM TOOTH EXTRACTION     2014   Family History: family history is not on file. Social History:  reports that she has never smoked. She has never used smokeless tobacco. She reports that she does not drink alcohol or use drugs.     Maternal Diabetes: No Genetic Screening: Declined Maternal Ultrasounds/Referrals: Declined Fetal Ultrasounds or other Referrals:  None Maternal Substance Abuse:  No Significant Maternal Medications:  None Significant Maternal Lab Results:  None - elevated ALT early in pregnancy, resolved Other Comments:  None  Review of Systems  Constitutional: Negative for chills and fever.  Eyes: Negative for blurred vision and double vision.  Respiratory: Negative for shortness of breath.   Cardiovascular: Negative for chest pain and palpitations.  Gastrointestinal: Negative for abdominal pain, constipation, diarrhea, nausea and vomiting.  Genitourinary: Negative for dysuria, flank pain, frequency and urgency.  Neurological: Negative for headaches.  Psychiatric/Behavioral: Negative for depression.   Maternal Medical History:  Reason for admission: Rupture of membranes and contractions.  Nausea.  Contractions: Onset was less than 1 hour ago.   Frequency: regular.      Dilation: 2.5 Effacement (%): 50 Station: -2 Exam by:: NB Last menstrual period 03/18/2016. Maternal Exam:  Uterine Assessment: Contraction strength is moderate.  Contraction frequency is regular.   Abdomen: Patient reports no abdominal tenderness.  Introitus: Normal vulva. Normal vagina.  Pelvis: adequate for delivery.   Cervix: Cervix evaluated by digital exam.     Physical Exam  Constitutional: She is oriented to person, place, and time. She appears well-developed and well-nourished. No distress.  Eyes: No scleral icterus.  Neck: Normal range of motion. Neck supple.  Cardiovascular: Normal rate.   Respiratory: Effort normal. No respiratory distress.  GI: Soft. She exhibits no distension. There is no tenderness.  Genitourinary: Vagina normal and uterus normal.  Musculoskeletal: Normal range of motion.  Neurological: She is alert and oriented to person, place, and time.  Skin: Skin is warm and dry.  Psychiatric: She has a normal mood and affect.    Prenatal labs: ABO, Rh: --/--/O NEG (09/28 1934) Antibody: NEG (09/28 1935) Rubella: Nonimmune (11/02 0000) RPR: Nonreactive (11/02 0000)  HBsAg: Negative (11/02 0000)  HIV: Non-reactive (11/02 0000)  GBS: Negative (04/30 0000)    ASSESSMENT AND PLAN:  Admit for active labor Labs pending Epidural when desired Continuous fetal monitoring   1. Fetal Well being  - Fetal Tracing: Cat I  - Ultrasound: Normal anatomy reviewed, as above - Group B Streptococcus: neg - Presentation: vtx confirmed by  sutures   2. Routine OB: - Prenatal labs reviewed, as above - Rh neg  3. Induction of Labor:  -  Contractions external toco in place, q6 min - start pitocin   Christeen DouglasBethany Zebastian Carico 12/06/2016, 4:13 PM

## 2016-12-06 NOTE — Anesthesia Preprocedure Evaluation (Signed)
Anesthesia Evaluation  Patient identified by MRN, date of birth, ID band Patient awake    Reviewed: Allergy & Precautions, NPO status , Patient's Chart, lab work & pertinent test results  History of Anesthesia Complications (+) PONV and history of anesthetic complications  Airway Mallampati: II  TM Distance: >3 FB     Dental no notable dental hx.    Pulmonary neg pulmonary ROS,    Pulmonary exam normal        Cardiovascular negative cardio ROS Normal cardiovascular exam     Neuro/Psych negative neurological ROS  negative psych ROS   GI/Hepatic negative GI ROS, Neg liver ROS,   Endo/Other  negative endocrine ROS  Renal/GU negative Renal ROS  negative genitourinary   Musculoskeletal negative musculoskeletal ROS (+)   Abdominal Normal abdominal exam  (+)   Peds negative pediatric ROS (+)  Hematology negative hematology ROS (+)   Anesthesia Other Findings   Reproductive/Obstetrics (+) Pregnancy                             Anesthesia Physical Anesthesia Plan  ASA: II  Anesthesia Plan: Epidural   Post-op Pain Management:    Induction:   Airway Management Planned: Natural Airway  Additional Equipment:   Intra-op Plan:   Post-operative Plan:   Informed Consent: I have reviewed the patients History and Physical, chart, labs and discussed the procedure including the risks, benefits and alternatives for the proposed anesthesia with the patient or authorized representative who has indicated his/her understanding and acceptance.   Dental advisory given  Plan Discussed with: CRNA and Surgeon  Anesthesia Plan Comments:         Anesthesia Quick Evaluation

## 2016-12-06 NOTE — Discharge Summary (Signed)
Obstetrical Discharge Summary  Patient Name: Janet Greer DOB: 05/09/1996 MRN: [id number]  Date of Admission: 12/06/2016 Date of Discharge: 12/07/2016  Primary OB: Janet Greer  Gestational Age at Delivery: 2995w6d   Antepartum complications:  - rubella non-immune - ALT elevated early in pregnancy - Rh neg  Admitting Diagnosis: Labor, SROM Secondary Diagnosis: Patient Active Problem List   Diagnosis Date Noted  . Full-term premature rupture of membranes with onset of labor within 24 hours of rupture 12/06/2016  . Indication for care in labor and delivery, antepartum 12/05/2016    Augmentation: Pitocin Complications: Baby in the NICU for hypoglycemia Intrapartum complications/course: Cat II strip, chorioamnionitis, meconium fluid, inadequate pain control 20yo G1P0 at 40+0wks presented with PROM, but contractions started soon after. Pitocin augmentation, with Cat II strip overnight, periods of min var and decels with recovery to baseline and moderate variability. Meconium stained fluid overnight. Pt did not receive consistent pain control with epidural and received several re-dosing boluses. She developed maternal tachycardia, fetal tachy and maternal temp overnight, and was started on chorio abx amp/gent. Tylenol helped reduce fever but baby still tachy. Good cervical change without augmentation and she entered the second stage of labor, with involuntary pushing. Maternal efforts excellent, poor pain control with mom asking that we not check her, touch her or talk to her towards the end. Nonetheless, she pushed effectively and well, and delivered the baby's head in an LOA position without a nuchal cord. Prompt delivery of fetal body and a very short umbilical cord, and baby placed on maternal abdomen. FOB cut cord and baby stimulated with vigorous crying - no resuscitation required for excellent apgars. However, cord blood gases attempted and unsuccessful. Placenta will be  sent. Abx stopped  Date of Delivery: 12/07/16 Delivered By: Janet Greer Delivery Type: spontaneous vaginal delivery Anesthesia: epidural Placenta: Spontaneous Laceration: none Episiotomy: none Newborn Data: This patient has no babies on file.   Discharge Physical Exam: Stable  BP 94/70   Pulse 96   Temp 98.6 F (37 C) (Oral)   Resp 18   Ht 5' (1.524 m)   Wt 212 lb (96.2 kg)   LMP 03/18/2016   SpO2 100%   BMI 41.40 kg/m   General: NAD CV: RRR, No M/R/G. Pulm: CTABL, nl effort, No W/R/R. ABD: s/nd/nt, fundus firm and below the umbilicus Lochia: moderate DVT Evaluation: LE non-ttp, no evidence of DVT on exam.  Hemoglobin  Date Value Ref Range Status  12/06/2016 11.0 (L) 12.0 - 16.0 g/dL Final   HCT  Date Value Ref Range Status  12/06/2016 32.9 (L) 35.0 - 47.0 % Final    Post partum course: Stable  Postpartum Procedures: None Disposition: stable, discharge to home. Baby Feeding: breastmilk Baby Disposition: Baby to stay in NICU due to low glucose  Rh Immune globulin given: Yes Rubella vaccine given: Yes Tdap vaccine given in AP or PP setting: 09/17/16 Flu vaccine given in AP or PP setting: declined  Contraception: discuss at 6 week visit  Prenatal Labs:   MBT O- Ab screen Negative Pap HIV Negative Hep B/RPR Negative/Non reactive  Rubella Non Immune VZV Immune Early P:C ratio: 92   Plan:  Janet Greer was discharged to home in good condition. [facility] weeks   Discharge Medications: PNV, Fe, Ibuprofen    Signed: [name], RN, MSN, CNM, FNP

## 2016-12-07 ENCOUNTER — Inpatient Hospital Stay: Payer: Medicaid Other | Admitting: Anesthesiology

## 2016-12-07 MED ORDER — FENTANYL 2.5 MCG/ML W/ROPIVACAINE 0.2% IN NS 100 ML EPIDURAL INFUSION (ARMC-ANES)
EPIDURAL | Status: DC | PRN
  Administered 2016-12-07: 10 mL/h via EPIDURAL

## 2016-12-07 MED ORDER — DIPHENHYDRAMINE HCL 50 MG/ML IJ SOLN
12.5000 mg | INTRAMUSCULAR | Status: DC | PRN
  Administered 2016-12-07: 12.5 mg via INTRAVENOUS
  Filled 2016-12-07: qty 1

## 2016-12-07 MED ORDER — MEASLES, MUMPS & RUBELLA VAC ~~LOC~~ INJ
0.5000 mL | INJECTION | Freq: Once | SUBCUTANEOUS | Status: DC
  Filled 2016-12-07 (×3): qty 0.5

## 2016-12-07 MED ORDER — SODIUM CHLORIDE 0.9 % IV SOLN
2.0000 g | Freq: Four times a day (QID) | INTRAVENOUS | Status: DC
  Administered 2016-12-07: 2 g via INTRAVENOUS

## 2016-12-07 MED ORDER — ACETAMINOPHEN 500 MG PO TABS
1000.0000 mg | ORAL_TABLET | Freq: Once | ORAL | Status: AC
  Administered 2016-12-07: 1000 mg via ORAL

## 2016-12-07 MED ORDER — FENTANYL 2.5 MCG/ML W/ROPIVACAINE 0.2% IN NS 100 ML EPIDURAL INFUSION (ARMC-ANES): 10.0000 mL/h | EPIDURAL | Status: DC

## 2016-12-07 MED ORDER — DIBUCAINE 1 % RE OINT: 1.0000 "application " | TOPICAL_OINTMENT | RECTAL | Status: DC | PRN

## 2016-12-07 MED ORDER — LIDOCAINE HCL (PF) 1 % IJ SOLN
INTRAMUSCULAR | Status: DC | PRN
  Administered 2016-12-07: 3 mL

## 2016-12-07 MED ORDER — SODIUM CHLORIDE 0.9 % IV SOLN
INTRAVENOUS | Status: AC
  Administered 2016-12-07: 2 g via INTRAVENOUS
  Filled 2016-12-07: qty 2000

## 2016-12-07 MED ORDER — DIPHENHYDRAMINE HCL 25 MG PO CAPS: 25.0000 mg | ORAL_CAPSULE | Freq: Four times a day (QID) | ORAL | Status: DC | PRN

## 2016-12-07 MED ORDER — SODIUM CHLORIDE 0.9% FLUSH
3.0000 mL | Freq: Two times a day (BID) | INTRAVENOUS | Status: DC
Start: 2016-12-07 — End: 2016-12-07

## 2016-12-07 MED ORDER — BISACODYL 10 MG RE SUPP: 10.0000 mg | Freq: Every day | RECTAL | Status: DC | PRN

## 2016-12-07 MED ORDER — LIDOCAINE HCL (PF) 2 % IJ SOLN
INTRAMUSCULAR | Status: DC | PRN
  Administered 2016-12-07: 10 mL via INTRADERMAL

## 2016-12-07 MED ORDER — FLEET ENEMA 7-19 GM/118ML RE ENEM: 1.0000 | ENEMA | Freq: Every day | RECTAL | Status: DC | PRN

## 2016-12-07 MED ORDER — BUPIVACAINE HCL (PF) 0.25 % IJ SOLN
INTRAMUSCULAR | Status: DC | PRN
  Administered 2016-12-07: 4 mL via EPIDURAL
  Administered 2016-12-07: 10 mL via EPIDURAL
  Administered 2016-12-07: 4 mL via EPIDURAL

## 2016-12-07 MED ORDER — TETANUS-DIPHTH-ACELL PERTUSSIS 5-2.5-18.5 LF-MCG/0.5 IM SUSP: 0.5000 mL | Freq: Once | INTRAMUSCULAR | Status: DC

## 2016-12-07 MED ORDER — GENTAMICIN SULFATE 40 MG/ML IJ SOLN
120.0000 mg | Freq: Three times a day (TID) | INTRAMUSCULAR | Status: DC
  Administered 2016-12-07: 120 mg via INTRAVENOUS
  Filled 2016-12-07 (×3): qty 3

## 2016-12-07 MED ORDER — BREAST MILK
ORAL | Status: DC
  Filled 2016-12-07 (×30): qty 1

## 2016-12-07 MED ORDER — PRENATAL MULTIVITAMIN CH
1.0000 | ORAL_TABLET | Freq: Every day | ORAL | Status: DC
  Administered 2016-12-07 – 2016-12-10 (×4): 1 via ORAL
  Filled 2016-12-07 (×4): qty 1

## 2016-12-07 MED ORDER — PHENYLEPHRINE 40 MCG/ML (10ML) SYRINGE FOR IV PUSH (FOR BLOOD PRESSURE SUPPORT)
80.0000 ug | PREFILLED_SYRINGE | INTRAVENOUS | Status: DC | PRN
  Filled 2016-12-07: qty 5

## 2016-12-07 MED ORDER — SODIUM CHLORIDE 0.9% FLUSH: 3.0000 mL | INTRAVENOUS | Status: DC | PRN

## 2016-12-07 MED ORDER — WITCH HAZEL-GLYCERIN EX PADS: 1.0000 "application " | MEDICATED_PAD | CUTANEOUS | Status: DC | PRN

## 2016-12-07 MED ORDER — ACETAMINOPHEN 500 MG PO TABS
ORAL_TABLET | ORAL | Status: AC
  Administered 2016-12-07: 1000 mg via ORAL
  Filled 2016-12-07: qty 2

## 2016-12-07 MED ORDER — IBUPROFEN 600 MG PO TABS
600.0000 mg | ORAL_TABLET | Freq: Four times a day (QID) | ORAL | Status: DC
  Administered 2016-12-07 – 2016-12-10 (×13): 600 mg via ORAL
  Filled 2016-12-07 (×14): qty 1

## 2016-12-07 MED ORDER — ONDANSETRON HCL 4 MG/2ML IJ SOLN: 4.0000 mg | INTRAMUSCULAR | Status: DC | PRN

## 2016-12-07 MED ORDER — ONDANSETRON HCL 4 MG PO TABS
4.0000 mg | ORAL_TABLET | ORAL | Status: DC | PRN
  Filled 2016-12-07: qty 1

## 2016-12-07 MED ORDER — BENZOCAINE-MENTHOL 20-0.5 % EX AERO: 1.0000 "application " | INHALATION_SPRAY | CUTANEOUS | Status: DC | PRN

## 2016-12-07 MED ORDER — EPHEDRINE 5 MG/ML INJ
10.0000 mg | INTRAVENOUS | Status: DC | PRN
Start: 2016-12-07 — End: 2016-12-07
  Administered 2016-12-07: 10 mg via INTRAVENOUS
  Filled 2016-12-07: qty 2

## 2016-12-07 MED ORDER — LIDOCAINE-EPINEPHRINE (PF) 1.5 %-1:200000 IJ SOLN
INTRAMUSCULAR | Status: DC | PRN
  Administered 2016-12-07: 3 mL via PERINEURAL

## 2016-12-07 MED ORDER — LACTATED RINGERS IV SOLN: 500.0000 mL | Freq: Once | INTRAVENOUS | Status: DC

## 2016-12-07 MED ORDER — SODIUM CHLORIDE 0.9 % IV SOLN: 250.0000 mL | INTRAVENOUS | Status: DC | PRN

## 2016-12-07 MED ORDER — COCONUT OIL OIL: 1.0000 "application " | TOPICAL_OIL | Status: DC | PRN

## 2016-12-07 MED ORDER — ZOLPIDEM TARTRATE 5 MG PO TABS: 5.0000 mg | ORAL_TABLET | Freq: Every evening | ORAL | Status: DC | PRN

## 2016-12-07 MED ORDER — SENNOSIDES-DOCUSATE SODIUM 8.6-50 MG PO TABS
2.0000 | ORAL_TABLET | ORAL | Status: DC
  Administered 2016-12-08 – 2016-12-10 (×3): 2 via ORAL
  Filled 2016-12-07 (×3): qty 2

## 2016-12-07 MED ORDER — ACETAMINOPHEN 325 MG PO TABS
650.0000 mg | ORAL_TABLET | ORAL | Status: DC | PRN
  Administered 2016-12-07 – 2016-12-08 (×2): 650 mg via ORAL
  Filled 2016-12-07: qty 2

## 2016-12-07 MED ORDER — SIMETHICONE 80 MG PO CHEW: 80.0000 mg | CHEWABLE_TABLET | ORAL | Status: DC | PRN

## 2016-12-07 MED ORDER — EPHEDRINE 5 MG/ML INJ
10.0000 mg | INTRAVENOUS | Status: DC | PRN
  Filled 2016-12-07: qty 10
  Filled 2016-12-07: qty 2

## 2016-12-07 NOTE — Progress Notes (Signed)
Janet Greer is a 21 y.o. G1P0 at 4131w0d with PROM yesterday at 2pm,  Now 17hrs ago. She developed a fever and fetal tachycardia overnight and started on amp/gent. Temp now resolved with tylenol but maternal and fetal tachycardia still present. Baby with moderate variability, no accels, intermittent variable decels, a few late decels. Cat II strip overall. IVF bolus given.  Her WBC was elevated to 16.0 on admission.  Pitocin has been off but she is still contracting and making cervical change.   Epidural not working now  Objective: BP 107/76   Pulse (!) 121   Temp 99.8 F (37.7 C) (Oral)   Resp 18   Ht 5' (1.524 m)   Wt 212 lb (96.2 kg)   LMP 03/18/2016   SpO2 100%   BMI 41.40 kg/m  No intake/output data recorded. No intake/output data recorded.  UC:   regular, every 1-2 minutes SVE: 8/90/+2/swollen anterior lip  Labs: Lab Results  Component Value Date   WBC 16.0 (H) 12/06/2016   HGB 11.0 (L) 12/06/2016   HCT 32.9 (L) 12/06/2016   MCV 76.2 (L) 12/06/2016   PLT 254 12/06/2016    Assessment / Plan: IUPC placed Anesthesia called Anticipate NSVD- continuous fetal monitoring  Christeen DouglasBethany Katheline Brendlinger 12/07/2016, 7:21 AM

## 2016-12-07 NOTE — Lactation Note (Signed)
This note was copied from a baby's chart. Lactation Consultation Note  Patient Name: Janet Greer [id number]'s Date: 12/07/2016 Reason for consult: Follow-up assessment Baby had latched and nursed sporadically in L&D after birth per RN. I asissted after that and saw Janet Greer breastfeed quite well for 6-7 minutes before I had to leave room while he was still nursing. Later, [facility], Janet Greer told me he was jittery so she took his BS which was 3216. We put baby skin to skin to breast and fed him 10 ml formula at breast with curved tip syringe which he took quite well.   Maternal Data    Feeding Feeding Type: Breast Milk with Formula added Length of feed: 7 min  LATCH Score/Interventions Latch: Grasps breast easily, tongue down, lips flanged, rhythmical sucking.  Audible Swallowing: Spontaneous and intermittent  Type of Nipple: Everted at rest and after stimulation  Comfort (Breast/Nipple): Soft / non-tender     Hold (Positioning): Assistance needed to correctly position infant at breast and maintain latch.  LATCH Score: 9  Lactation Tools Discussed/Used Tools: Other (comment) (curved tip syringe for feed at breast)   Consult Status      Janet Greer 12/07/2016, 2:41 PM

## 2016-12-07 NOTE — Anesthesia Procedure Notes (Signed)
Epidural Patient location during procedure: OB Start time: 12/07/2016 12:16 AM End time: 12/07/2016 12:28 AM  Staffing Anesthesiologist: Yves DillARROLL, Hendrixx Severin Performed: anesthesiologist   Preanesthetic Checklist Completed: patient identified, site marked, surgical consent, pre-op evaluation, timeout performed, IV checked, risks and benefits discussed and monitors and equipment checked  Epidural Patient position: sitting Prep: Betadine Patient monitoring: heart rate, continuous pulse ox and blood pressure Approach: midline Location: L3-L4 Injection technique: LOR air  Needle:  Needle type: Tuohy  Needle gauge: 18 G Needle length: 9 cm and 9 Catheter type: closed end flexible Catheter size: 20 Guage Test dose: negative and 1.5% lidocaine with Epi 1:200 K  Assessment Events: blood not aspirated, injection not painful, no injection resistance, negative IV test and no paresthesia  Additional Notes Time out called.  Patient placed in sitting position.  Back prepped and draped in sterile fashion.  A skin wheal was made in the L3-L4 interspace with 1% Lidocaine plain.  An 18G Tuohy needle was advanced into the epidural space by a loss of resistance technique.  The epidural catheter was threaded 3 cm with a negative test dose.  No blood or paresthesias.  The patient tolerated the procedure well.  The catheter was affixed to the back in sterile fashion.  Reason for block:procedure for pain

## 2016-12-08 LAB — CBC
HEMATOCRIT: 26 % — AB (ref 35.0–47.0)
Hemoglobin: 8.5 g/dL — ABNORMAL LOW (ref 12.0–16.0)
MCH: 25.1 pg — AB (ref 26.0–34.0)
MCHC: 32.6 g/dL (ref 32.0–36.0)
MCV: 77 fL — ABNORMAL LOW (ref 80.0–100.0)
Platelets: 190 10*3/uL (ref 150–440)
RBC: 3.37 MIL/uL — ABNORMAL LOW (ref 3.80–5.20)
RDW: 16.7 % — AB (ref 11.5–14.5)
WBC: 13.9 10*3/uL — ABNORMAL HIGH (ref 3.6–11.0)

## 2016-12-08 LAB — RPR: RPR Ser Ql: NONREACTIVE

## 2016-12-08 LAB — FETAL SCREEN: Fetal Screen: NEGATIVE

## 2016-12-08 MED ORDER — FERROUS SULFATE 325 (65 FE) MG PO TABS
325.0000 mg | ORAL_TABLET | Freq: Two times a day (BID) | ORAL | Status: DC
  Administered 2016-12-08 – 2016-12-10 (×5): 325 mg via ORAL
  Filled 2016-12-08 (×5): qty 1

## 2016-12-08 MED ORDER — RHO D IMMUNE GLOBULIN 1500 UNIT/2ML IJ SOSY
300.0000 ug | PREFILLED_SYRINGE | Freq: Once | INTRAMUSCULAR | Status: AC
  Administered 2016-12-08: 300 ug via INTRAVENOUS
  Filled 2016-12-08: qty 2

## 2016-12-08 NOTE — Anesthesia Postprocedure Evaluation (Signed)
Anesthesia Post Note  Patient: Janet RowerChelsea Marie Greer  Procedure(s) Performed: * No procedures listed *  Patient location during evaluation: Mother Baby Anesthesia Type: Epidural Level of consciousness: awake and alert and oriented Pain management: satisfactory to patient Vital Signs Assessment: post-procedure vital signs reviewed and stable Respiratory status: respiratory function stable Cardiovascular status: blood pressure returned to baseline and stable Postop Assessment: no headache, no backache, epidural receding, no signs of nausea or vomiting, patient able to bend at knees and adequate PO intake Anesthetic complications: no     Last Vitals:  Vitals:   12/08/16 0453 12/08/16 0701  BP: (!) 101/57 108/60  Pulse: 90 78  Resp: 18 18  Temp: 36.5 C 36.7 C    Last Pain:  Vitals:   12/08/16 0836  TempSrc:   PainSc: 3                  Clydene PughBeane, Dantae Meunier D

## 2016-12-08 NOTE — Lactation Note (Signed)
This note was copied from a baby's chart. Lactation Consultation Note  Patient Name: Boy Melina CopaChelsea Scahill [id number]'s Date: 12/08/2016 Reason for consult: Follow-up assessment Mom c/o poor latch since [facility]. She had been pumping yesterday, skipped the middle of the night, and started again this morning. I reviewed importance of pump until he can take over breastfeeding again. Cornelius MorasOwen was quietly alert at last feeding session, so I taught Mom how to use football hold now that he has a lot of lines (IVs etc) She just needed minor adjustments so Cornelius MorasOwen was close to the breast and not twisted away from it. He latched and suckled for a few minutes. Mom said it felt like tugs like it felt yesterday. He was not very interested in feeding much, so I took her pumped colostrum in a curved tip syringe and let him get that while nursing. When he was done with that, I offered formula. He only took 2 more mls. He got angry when we tried to offer him some more. He fed maybe 5 minutes or so in total.  Since Mom c/o poor latch even when staff helps, I did fit her for a nipple shield. The 24 mm looks like it should work, he just was not interested in feeding at all at that point, so will try again later. I placed shield in pink plastic container at cribside. I also encouraged mom to continue to pump at least every 2-3 hours.   Maternal Data    Feeding Feeding Type: Breast Fed  LATCH Score/Interventions Latch: Grasps breast easily, tongue down, lips flanged, rhythmical sucking.  Audible Swallowing: A few with stimulation (and EBM or formula instilled via syringe)  Type of Nipple: Everted at rest and after stimulation  Comfort (Breast/Nipple): Soft / non-tender     Hold (Positioning): Assistance needed to correctly position infant at breast and maintain latch.  LATCH Score: 8  Lactation Tools Discussed/Used Tools:  (syringe at breast)   Consult Status      Sunday CornSandra  Clark Ryosuke Ericksen 12/08/2016, 1:05 PM

## 2016-12-09 LAB — RHOGAM INJECTION: Unit division: 0

## 2016-12-09 NOTE — Plan of Care (Signed)
Problem: Nutritional: Goal: Mothers verbalization of comfort with breastfeeding process will improve Outcome: Completed/Met Date Met: 12/09/16 Patient breast pumping; infant in SCN

## 2016-12-09 NOTE — Lactation Note (Signed)
This note was copied from a baby's chart. Lactation Consultation Note  Patient Name: Janet Greer Today's Date: 12/09/2016  [facility] today [facility]afina Mitchellsmari, to discuss that and other info about WIC. Mom now plans to pump an dbottle feed until her milk supply increases more. She was getting 2 ml this morning. Frequency of pumping reviewed.    Maternal Data    Feeding Feeding Type: Formula Nipple Type: Regular Length of feed: 20 min  LATCH Score/Interventions                      Lactation Tools Discussed/Used     Consult Status      Sunday CornSandra Clark Senan Urey 12/09/2016, 5:56 PM

## 2016-12-09 NOTE — Progress Notes (Signed)
Post Partum Day 2 Subjective: no complaints, up ad lib and voiding  Objective: Blood pressure 129/79, pulse 85, temperature 98 F (36.7 C), temperature source Oral, resp. rate 14, height 5' (1.524 m), weight 212 lb (96.2 kg), last menstrual period 03/18/2016, SpO2 100 %.  Physical Exam:  General: alert, cooperative and appears stated age Lochia: appropriate Uterine Fundus: firm DVT Evaluation: No evidence of DVT seen on physical exam.   Recent Labs  12/08/16 0549  HGB 8.5*  HCT 26.0*    Assessment/Plan: Plan for discharge tomorrow and Lactation consult   LOS: 3 days   Christeen DouglasBethany Jonty Morrical 12/09/2016, 8:00 PM

## 2016-12-10 MED ORDER — FERROUS SULFATE 325 (65 FE) MG PO TBEC: 325.0000 mg | DELAYED_RELEASE_TABLET | Freq: Three times a day (TID) | ORAL | 3 refills | Status: DC

## 2016-12-10 NOTE — Progress Notes (Signed)
Post Partum Day 3 Subjective: I do not want to go and leave my baby but, I only live 10 mins away. Baby has low glucose.   Objective: Blood pressure 120/78, pulse 78, temperature 98.1 F (36.7 C), temperature source Oral, resp. rate 20, height 5' (1.524 m), weight 212 lb (96.2 kg), last menstrual period 03/18/2016, SpO2 100 %.  Physical Exam:  General: alert, cooperative and appears stated age  Heart: s1S2, RRR, No M/R?G Lungs: CTA bilat, no W/R/R. Lochia: appropriate Uterine Fundus: firm Taking po well and voiding well DVT Evaluation: Neg Homans   Recent Labs  12/08/16 0549  HGB 8.5*  HCT 26.0*  WBC 13.9*  PLT 190    Assessment/Plan: A: 1. PPD#3 stable P: DC home   LOS: 4 days   Sharee PimpleCaron W Mandalyn Pasqua 12/10/2016, 11:40 AM

## 2016-12-10 NOTE — Discharge Instructions (Signed)
Anemia, Nonspecific Anemia is a condition in which the concentration of red blood cells or hemoglobin in the blood is below normal. Hemoglobin is a substance in red blood cells that carries oxygen to the tissues of the body. Anemia results in not enough oxygen reaching these tissues. What are the causes? Common causes of anemia include:  Excessive bleeding. Bleeding may be internal or external. This includes excessive bleeding from periods (in women) or from the intestine.  Poor nutrition.  Chronic kidney, thyroid, and liver disease.  Bone marrow disorders that decrease red blood cell production.  Cancer and treatments for cancer.  HIV, AIDS, and their treatments.  Spleen problems that increase red blood cell destruction.  Blood disorders.  Excess destruction of red blood cells due to infection, medicines, and autoimmune disorders. What are the signs or symptoms?  Minor weakness.  Dizziness.  Headache.  Palpitations.  Shortness of breath, especially with exercise.  Paleness.  Cold sensitivity.  Indigestion.  Nausea.  Difficulty sleeping.  Difficulty concentrating. Symptoms may occur suddenly or they may develop slowly. How is this diagnosed? Additional blood tests are often needed. These help your health care provider determine the best treatment. Your health care provider will check your stool for blood and look for other causes of blood loss. How is this treated? Treatment varies depending on the cause of the anemia. Treatment can include:  Supplements of iron, vitamin B12, or folic acid.  Hormone medicines.  A blood transfusion. This may be needed if blood loss is severe.  Hospitalization. This may be needed if there is significant continual blood loss.  Dietary changes.  Spleen removal. Follow these instructions at home: Keep all follow-up appointments. It often takes many weeks to correct anemia, and having your health care provider check on your  condition and your response to treatment is very important. Get help right away if:  You develop extreme weakness, shortness of breath, or chest pain.  You become dizzy or have trouble concentrating.  You develop heavy vaginal bleeding.  You develop a rash.  You have bloody or black, tarry stools.  You faint.  You vomit up blood.  You vomit repeatedly.  You have abdominal pain.  You have a fever or persistent symptoms for more than 2-3 days.  You have a fever and your symptoms suddenly get worse.  You are dehydrated. This information is not intended to replace advice given to you by your health care provider. Make sure you discuss any questions you have with your health care provider. Document Released: 08/13/2004 Document Revised: 12/18/2015 Document Reviewed: 12/30/2012 Elsevier Interactive Patient Education  2017 Elsevier Inc.  

## 2016-12-10 NOTE — Progress Notes (Signed)
Discharge instructions reviewed with patient.  Patient declined receiving MMR vaccine at this time, but will request it during follow-up at 6 week appointment.  Patient declined wheelchair and opted to walk out of hospital for discharge.  Patient provided teach back on signs and symptoms of infections and reasons to return to ED.

## 2017-05-24 ENCOUNTER — Encounter (HOSPITAL_COMMUNITY): Payer: Self-pay

## 2018-12-29 ENCOUNTER — Other Ambulatory Visit: Payer: Self-pay

## 2018-12-29 ENCOUNTER — Emergency Department
Admission: EM | Admit: 2018-12-29 | Discharge: 2018-12-30 | Disposition: A | Discharge status: Home / Self Care | Payer: Medicaid Other | Attending: Emergency Medicine | Admitting: Emergency Medicine

## 2018-12-29 ENCOUNTER — Emergency Department: Payer: Medicaid Other

## 2018-12-29 DIAGNOSIS — N390 Urinary tract infection, site not specified: Secondary | ICD-10-CM

## 2018-12-29 DIAGNOSIS — J039 Acute tonsillitis, unspecified: Secondary | ICD-10-CM | POA: Insufficient documentation

## 2018-12-29 DIAGNOSIS — M791 Myalgia, unspecified site: Secondary | ICD-10-CM | POA: Insufficient documentation

## 2018-12-29 DIAGNOSIS — R059 Cough, unspecified: Secondary | ICD-10-CM

## 2018-12-29 DIAGNOSIS — Z20828 Contact with and (suspected) exposure to other viral communicable diseases: Secondary | ICD-10-CM | POA: Insufficient documentation

## 2018-12-29 DIAGNOSIS — R05 Cough: Secondary | ICD-10-CM

## 2018-12-29 DIAGNOSIS — R51 Headache: Secondary | ICD-10-CM | POA: Insufficient documentation

## 2018-12-29 LAB — CBC WITH DIFFERENTIAL/PLATELET
Abs Immature Granulocytes: 0.04 10*3/uL (ref 0.00–0.07)
Basophils Absolute: 0 10*3/uL (ref 0.0–0.1)
Basophils Relative: 0 %
Eosinophils Absolute: 0 10*3/uL (ref 0.0–0.5)
Eosinophils Relative: 0 %
HCT: 38.3 % (ref 36.0–46.0)
Hemoglobin: 12.4 g/dL (ref 12.0–15.0)
Immature Granulocytes: 0 %
Lymphocytes Relative: 11 %
Lymphs Abs: 1.4 10*3/uL (ref 0.7–4.0)
MCH: 25.5 pg — ABNORMAL LOW (ref 26.0–34.0)
MCHC: 32.4 g/dL (ref 30.0–36.0)
MCV: 78.6 fL — ABNORMAL LOW (ref 80.0–100.0)
Monocytes Absolute: 0.7 10*3/uL (ref 0.1–1.0)
Monocytes Relative: 6 %
Neutro Abs: 10.8 10*3/uL — ABNORMAL HIGH (ref 1.7–7.7)
Neutrophils Relative %: 83 %
Platelets: 291 10*3/uL (ref 150–400)
RBC: 4.87 MIL/uL (ref 3.87–5.11)
RDW: 14.5 % (ref 11.5–15.5)
WBC: 13.1 10*3/uL — ABNORMAL HIGH (ref 4.0–10.5)
nRBC: 0 % (ref 0.0–0.2)

## 2018-12-29 LAB — COMPREHENSIVE METABOLIC PANEL
ALT: 32 U/L (ref 0–44)
AST: 20 U/L (ref 15–41)
Albumin: 3.8 g/dL (ref 3.5–5.0)
Alkaline Phosphatase: 73 U/L (ref 38–126)
Anion gap: 6 (ref 5–15)
BUN: 11 mg/dL (ref 6–20)
CO2: 23 mmol/L (ref 22–32)
Calcium: 8.7 mg/dL — ABNORMAL LOW (ref 8.9–10.3)
Chloride: 103 mmol/L (ref 98–111)
Creatinine, Ser: 0.57 mg/dL (ref 0.44–1.00)
GFR calc Af Amer: >60 mL/min (ref >60)
GFR calc non Af Amer: >60 mL/min (ref >60)
Glucose, Bld: 116 mg/dL — ABNORMAL HIGH (ref 70–99)
Potassium: 3.5 mmol/L (ref 3.5–5.1)
Sodium: 132 mmol/L — ABNORMAL LOW (ref 135–145)
Total Bilirubin: 0.7 mg/dL (ref 0.3–1.2)
Total Protein: 7.7 g/dL (ref 6.5–8.1)

## 2018-12-29 LAB — URINALYSIS, COMPLETE (UACMP) WITH MICROSCOPIC
Bacteria, UA: NONE SEEN
Bilirubin Urine: NEGATIVE
Glucose, UA: NEGATIVE mg/dL
Ketones, ur: NEGATIVE mg/dL
Nitrite: NEGATIVE
Protein, ur: NEGATIVE mg/dL
RBC / HPF: >50 RBC/hpf — ABNORMAL HIGH (ref 0–5)
Specific Gravity, Urine: 1.02 (ref 1.005–1.030)
pH: 6 (ref 5.0–8.0)

## 2018-12-29 LAB — POCT PREGNANCY, URINE: Preg Test, Ur: NEGATIVE

## 2018-12-29 MED ORDER — MAGIC MOUTHWASH
10.0000 mL | Freq: Once | ORAL | Status: AC
  Administered 2018-12-30: 10 mL via ORAL
  Filled 2018-12-29: qty 10

## 2018-12-29 MED ORDER — DEXAMETHASONE 10 MG/ML FOR PEDIATRIC ORAL USE
10.0000 mg | Freq: Once | INTRAMUSCULAR | Status: AC
  Administered 2018-12-30: 10 mg via ORAL
  Filled 2018-12-29: qty 1

## 2018-12-29 MED ORDER — AMOXICILLIN 500 MG PO CAPS
500.0000 mg | ORAL_CAPSULE | Freq: Once | ORAL | Status: AC
  Administered 2018-12-30: 500 mg via ORAL
  Filled 2018-12-29: qty 1

## 2018-12-29 NOTE — ED Notes (Signed)
Pt denies being around anyone sick; no recent traveling; pt states sweats/chills/nausea/HA/sore throat at home.

## 2018-12-29 NOTE — ED Triage Notes (Signed)
Patient c/o sore throat, body aches, head ache, fatigue, loss of taste beginning yesterday. Patient reports dry cough.

## 2018-12-29 NOTE — ED Notes (Signed)
Pt denies any needs.

## 2018-12-29 NOTE — ED Provider Notes (Signed)
[facility] Emergency Department Provider Note   ____________________________________________   First MD Initiated Contact with Patient 12/29/18 2319     (approximate)  I have reviewed the triage vital signs and the nursing notes.   HISTORY  Chief Complaint Sore Throat    HPI Janet Greer is a 23 y.o. female who presents to the ED from home with a chief complaint of sore throat, body aches, headache, fatigue, chills and dry cough.  Symptoms x1 day.  Denies associated fever, shortness of breath, chest pain, abdominal pain, nausea, vomiting or diarrhea.  Denies recent travel, trauma or exposure to persons diagnosed with coronavirus.       Past Medical History:  Diagnosis Date  . Complication of anesthesia   . PONV (postoperative nausea and vomiting)     Patient Active Problem List   Diagnosis Date Noted  . Full-term premature rupture of membranes with onset of labor within 24 hours of rupture 12/06/2016  . Indication for care in labor and delivery, antepartum 12/05/2016    Past Surgical History:  Procedure Laterality Date  . Sawmill EXTRACTION     2014    Prior to Admission medications   Medication Sig Start Date End Date Taking? Authorizing Provider  ferrous sulfate 325 (65 FE) MG EC tablet Take 1 tablet (325 mg total) by mouth 3 (three) times daily with meals. 12/10/16 12/10/17  Catheryn Bacon, CNM  Prenatal Multivit-Min-Fe-FA (PRENATAL VITAMINS) 0.8 MG tablet Take 1 tablet by mouth daily. 06/25/16   Schaevitz, Randall An, MD    Allergies Patient has no known allergies.  No family history on file.  Social History Social History   Tobacco Use  . Smoking status: Never Smoker  . Smokeless tobacco: Never Used  Substance Use Topics  . Alcohol use: No  . Drug use: No    Review of Systems  Constitutional: Positive for chills Eyes: No visual changes. ENT: Positive for sore throat. Cardiovascular: Denies chest pain.  Respiratory: Positive for dry cough.  Denies shortness of breath. Gastrointestinal: No abdominal pain.  No nausea, no vomiting.  No diarrhea.  No constipation. Genitourinary: Negative for dysuria. Musculoskeletal: Negative for back pain. Skin: Negative for rash. Neurological: Negative for headaches, focal weakness or numbness.   ____________________________________________   PHYSICAL EXAM:  VITAL SIGNS: ED Triage Vitals  Enc Vitals Group     BP 12/29/18 1910 (!) 125/101     Pulse Rate 12/29/18 1910 (!) 126     Resp 12/29/18 1910 18     Temp 12/29/18 1910 98.6 F (37 C)     Temp src --      SpO2 12/29/18 1910 98 %     Weight 12/29/18 1911 198 lb (89.8 kg)     Height 12/29/18 1911 5' (1.524 m)     Head Circumference --      Peak Flow --      Pain Score 12/29/18 1911 5     Pain Loc --      Pain Edu? --      Excl. in [place]? --     Constitutional: Alert and oriented. Well appearing and in no acute distress. Eyes: Conjunctivae are normal. PERRL. EOMI. Head: Atraumatic. Nose: No congestion/rhinnorhea. Mouth/Throat: Mucous membranes are moist.  Oropharynx mildly erythematous with symmetrically bilateral swollen tonsils without exudates or peritonsillar abscess.  There is no hoarse or muffled voice.  There is no drooling. Neck: No stridor.  Supple neck without meningismus. Hematological/Lymphatic/Immunilogical: No cervical lymphadenopathy.  Cardiovascular: Normal rate, regular rhythm. Grossly normal heart sounds.  Good peripheral circulation. Respiratory: Normal respiratory effort.  No retractions. Lungs CTAB. Gastrointestinal: Soft and nontender. No distention. No abdominal bruits. No CVA tenderness. Musculoskeletal: No lower extremity tenderness nor edema.  No joint effusions. Neurologic:  Normal speech and language. No gross focal neurologic deficits are appreciated. No gait instability. Skin:  Skin is warm, dry and intact. No rash noted.  No petechiae. Psychiatric: Mood and  affect are normal. Speech and behavior are normal.  ____________________________________________   LABS (all labs ordered are listed, but only abnormal results are displayed)  Labs Reviewed  COMPREHENSIVE METABOLIC PANEL - Abnormal; Notable for the following components:      Result Value   Sodium 132 (*)    Glucose, Bld 116 (*)    Calcium 8.7 (*)    All other components within normal limits  CBC WITH DIFFERENTIAL/PLATELET - Abnormal; Notable for the following components:   WBC 13.1 (*)    MCV 78.6 (*)    MCH 25.5 (*)    Neutro Abs 10.8 (*)    All other components within normal limits  URINALYSIS, COMPLETE (UACMP) WITH MICROSCOPIC - Abnormal; Notable for the following components:   Color, Urine YELLOW (*)    APPearance HAZY (*)    Hgb urine dipstick LARGE (*)    Leukocytes,Ua LARGE (*)    RBC / HPF >50 (*)    All other components within normal limits  NOVEL CORONAVIRUS, NAA (HOSPITAL ORDER, SEND-OUT TO REF LAB)  POC URINE PREG, ED  POCT PREGNANCY, URINE   ____________________________________________  EKG  None ____________________________________________  RADIOLOGY  ED MD interpretation: No acute cardiopulmonary process  Official radiology report(s): Dg Chest Port 1 View  Result Date: 12/29/2018 CLINICAL DATA:  Cough EXAM: PORTABLE CHEST 1 VIEW COMPARISON:  None. FINDINGS: The heart size and mediastinal contours are within normal limits. Both lungs are clear. The visualized skeletal structures are unremarkable. IMPRESSION: No active disease. Electronically Signed   By: Deatra RobinsonKevin  Herman M.D.   On: 12/29/2018 21:43    ____________________________________________   PROCEDURES  Procedure(s) performed (including Critical Care):  Procedures   ____________________________________________   INITIAL IMPRESSION / ASSESSMENT AND PLAN / ED COURSE  As part of my medical decision making, I reviewed the following data within the electronic MEDICAL RECORD NUMBER Nursing notes  reviewed and incorporated, Labs reviewed and Notes from prior ED visits     Janet Greer was evaluated in Emergency Department on 12/30/2018 for the symptoms described in the history of present illness. She was evaluated in the context of the global COVID-19 pandemic, which necessitated consideration that the patient might be at risk for infection with the SARS-CoV-2 virus that causes COVID-19. Institutional protocols and algorithms that pertain to the evaluation of patients at risk for COVID-19 are in a state of rapid change based on information released by regulatory bodies including the CDC and federal and state organizations. These policies and algorithms were followed during the patient's care in the ED.   23 year old female who presents with multiple medical complaints.  Laboratory urinalysis results demonstrate UTI.  Will administer Decadron, Magic mouthwash and start patient on Amoxicillin.  Send out COVID test obtained.  Strict return precautions given.  Patient verbalizes understanding and agrees with plan of care.      ____________________________________________   FINAL CLINICAL IMPRESSION(S) / ED DIAGNOSES  Final diagnoses:  Tonsillitis  Urinary tract infection without hematuria, site unspecified     ED Discharge Orders  None       Note:  This document was prepared using Dragon voice recognition software and may include unintentional dictation errors.   Irean HongSung, Jade J, MD 12/31/18 [phone]

## 2018-12-30 MED ORDER — AMOXICILLIN 500 MG PO CAPS: 500.0000 mg | ORAL_CAPSULE | Freq: Three times a day (TID) | ORAL | 0 refills | Status: DC

## 2018-12-30 MED ORDER — MAGIC MOUTHWASH: 5.0000 mL | Freq: Three times a day (TID) | ORAL | 0 refills | Status: DC | PRN

## 2018-12-30 NOTE — Discharge Instructions (Signed)
1.  Start antibiotic as prescribed (Amoxicillin 500 mg 3 times daily x7 days). 2.  You may use Magic mouthwash as needed for throat discomfort. 3.  Quarantine yourself until the results of your COVID test comes back.  This will generally be in 2 to 3 days.  You will be notified of any positive results but you may also check Mychart for results. 4.  Return to the ER for worsening symptoms, persistent vomiting, difficulty breathing or other concerns.

## 2018-12-30 NOTE — ED Notes (Signed)
Pt signed printed d/c paperwork.  

## 2018-12-31 LAB — NOVEL CORONAVIRUS, NAA (HOSP ORDER, SEND-OUT TO REF LAB; TAT 18-24 HRS): SARS-CoV-2, NAA: NOT DETECTED

## 2019-08-13 ENCOUNTER — Other Ambulatory Visit: Payer: Self-pay

## 2019-08-13 ENCOUNTER — Emergency Department: Payer: Medicaid Other

## 2019-08-13 ENCOUNTER — Emergency Department
Admission: EM | Admit: 2019-08-13 | Discharge: 2019-08-13 | Disposition: A | Discharge status: Home / Self Care | Payer: Medicaid Other | Attending: Emergency Medicine | Admitting: Emergency Medicine

## 2019-08-13 DIAGNOSIS — R1030 Lower abdominal pain, unspecified: Secondary | ICD-10-CM | POA: Diagnosis present

## 2019-08-13 DIAGNOSIS — N730 Acute parametritis and pelvic cellulitis: Secondary | ICD-10-CM

## 2019-08-13 LAB — URINALYSIS, ROUTINE W REFLEX MICROSCOPIC
Bilirubin Urine: NEGATIVE
Glucose, UA: NEGATIVE mg/dL
Hgb urine dipstick: NEGATIVE
Ketones, ur: NEGATIVE mg/dL
Leukocytes,Ua: NEGATIVE
Nitrite: NEGATIVE
Protein, ur: NEGATIVE mg/dL
Specific Gravity, Urine: >1.046 — ABNORMAL HIGH (ref 1.005–1.030)
pH: 6 (ref 5.0–8.0)

## 2019-08-13 LAB — CBC WITH DIFFERENTIAL/PLATELET
Abs Immature Granulocytes: 0.02 10*3/uL (ref 0.00–0.07)
Basophils Absolute: 0 10*3/uL (ref 0.0–0.1)
Basophils Relative: 0 %
Eosinophils Absolute: 0.1 10*3/uL (ref 0.0–0.5)
Eosinophils Relative: 1 %
HCT: 40.1 % (ref 36.0–46.0)
Hemoglobin: 13 g/dL (ref 12.0–15.0)
Immature Granulocytes: 0 %
Lymphocytes Relative: 27 %
Lymphs Abs: 2.2 10*3/uL (ref 0.7–4.0)
MCH: 25.8 pg — ABNORMAL LOW (ref 26.0–34.0)
MCHC: 32.4 g/dL (ref 30.0–36.0)
MCV: 79.6 fL — ABNORMAL LOW (ref 80.0–100.0)
Monocytes Absolute: 0.5 10*3/uL (ref 0.1–1.0)
Monocytes Relative: 6 %
Neutro Abs: 5.2 10*3/uL (ref 1.7–7.7)
Neutrophils Relative %: 66 %
Platelets: 251 10*3/uL (ref 150–400)
RBC: 5.04 MIL/uL (ref 3.87–5.11)
RDW: 13.6 % (ref 11.5–15.5)
WBC: 8 10*3/uL (ref 4.0–10.5)
nRBC: 0 % (ref 0.0–0.2)

## 2019-08-13 LAB — WET PREP, GENITAL
Clue Cells Wet Prep HPF POC: NONE SEEN
Trich, Wet Prep: NONE SEEN
Yeast Wet Prep HPF POC: NONE SEEN

## 2019-08-13 LAB — COMPREHENSIVE METABOLIC PANEL
ALT: 26 U/L (ref 0–44)
AST: 18 U/L (ref 15–41)
Albumin: 3.8 g/dL (ref 3.5–5.0)
Alkaline Phosphatase: 62 U/L (ref 38–126)
Anion gap: 16 — ABNORMAL HIGH (ref 5–15)
BUN: 15 mg/dL (ref 6–20)
CO2: 25 mmol/L (ref 22–32)
Calcium: 9.1 mg/dL (ref 8.9–10.3)
Chloride: 104 mmol/L (ref 98–111)
Creatinine, Ser: 0.77 mg/dL (ref 0.44–1.00)
GFR calc Af Amer: >60 mL/min (ref >60)
GFR calc non Af Amer: >60 mL/min (ref >60)
Glucose, Bld: 107 mg/dL — ABNORMAL HIGH (ref 70–99)
Potassium: 3.7 mmol/L (ref 3.5–5.1)
Sodium: 145 mmol/L (ref 135–145)
Total Bilirubin: 0.6 mg/dL (ref 0.3–1.2)
Total Protein: 7.9 g/dL (ref 6.5–8.1)

## 2019-08-13 LAB — HCG, QUANTITATIVE, PREGNANCY: hCG, Beta Chain, Quant, S: <1 m[IU]/mL (ref <5)

## 2019-08-13 LAB — LIPASE, BLOOD: Lipase: 25 U/L (ref 11–51)

## 2019-08-13 MED ORDER — SODIUM CHLORIDE 0.9 % IV BOLUS
1000.0000 mL | Freq: Once | INTRAVENOUS | Status: AC
  Administered 2019-08-13: 1000 mL via INTRAVENOUS

## 2019-08-13 MED ORDER — IOHEXOL 300 MG/ML  SOLN
100.0000 mL | Freq: Once | INTRAMUSCULAR | Status: AC | PRN
  Administered 2019-08-13: 100 mL via INTRAVENOUS

## 2019-08-13 MED ORDER — CEFTRIAXONE SODIUM 1 G IJ SOLR
500.0000 mg | Freq: Once | INTRAMUSCULAR | Status: AC
  Administered 2019-08-13: 500 mg via INTRAMUSCULAR
  Filled 2019-08-13: qty 10

## 2019-08-13 MED ORDER — DOXYCYCLINE MONOHYDRATE 100 MG PO TABS: 100.0000 mg | ORAL_TABLET | Freq: Two times a day (BID) | ORAL | 0 refills | Status: AC

## 2019-08-13 MED ORDER — ACETAMINOPHEN 325 MG PO TABS
650.0000 mg | ORAL_TABLET | Freq: Once | ORAL | Status: AC
  Administered 2019-08-13: 650 mg via ORAL
  Filled 2019-08-13: qty 2

## 2019-08-13 MED ORDER — SODIUM CHLORIDE 0.9 % IV BOLUS: 1000.0000 mL | Freq: Once | INTRAVENOUS | Status: DC

## 2019-08-13 MED ORDER — KETOROLAC TROMETHAMINE 30 MG/ML IJ SOLN
15.0000 mg | Freq: Once | INTRAMUSCULAR | Status: AC
  Administered 2019-08-13: 15 mg via INTRAVENOUS
  Filled 2019-08-13: qty 1

## 2019-08-13 MED ORDER — ONDANSETRON HCL 4 MG/2ML IJ SOLN
4.0000 mg | Freq: Once | INTRAMUSCULAR | Status: AC
  Administered 2019-08-13: 4 mg via INTRAVENOUS
  Filled 2019-08-13: qty 2

## 2019-08-13 MED ORDER — FENTANYL CITRATE (PF) 100 MCG/2ML IJ SOLN
50.0000 ug | Freq: Once | INTRAMUSCULAR | Status: AC
  Administered 2019-08-13: 50 ug via INTRAVENOUS
  Filled 2019-08-13: qty 2

## 2019-08-13 MED ORDER — ONDANSETRON HCL 4 MG/2ML IJ SOLN: 4.0000 mg | Freq: Once | INTRAMUSCULAR | Status: DC

## 2019-08-13 MED ORDER — FENTANYL CITRATE (PF) 100 MCG/2ML IJ SOLN: 75.0000 ug | Freq: Once | INTRAMUSCULAR | Status: DC

## 2019-08-13 NOTE — ED Provider Notes (Signed)
[facility] Emergency Department Provider Note  ____________________________________________   First MD Initiated Contact with Patient 08/13/19 [phone]     (approximate)  I have reviewed the triage vital signs and the nursing notes.   HISTORY  Chief Complaint Abdominal Pain    HPI Janet Greer is a 24 y.o. female with prior pregnancy who comes in for sudden onset lower abdominal pain.  Patient states around 6 AM she had lower abdominal pain that woke her up from sleep.  The pain was severe, constant, nothing makes it better, nothing makes it worse.  Denies any changes in her bowel movements, vomiting, fevers.  Denies having this pain before.  Denies any abdominal surgeries.  The pain seems to be most situated over her bladder but denies any urinary symptoms.  Denies any vaginal bleeding, new vaginal discharge.  She is sexually active with one partner.  She denies history of STDs.  She states she has a Nexplanon that she is had for years.  She not had a period for 6 months while on her Nexplanon.   Past Medical History:  Diagnosis Date  . Complication of anesthesia   . PONV (postoperative nausea and vomiting)     Patient Active Problem List   Diagnosis Date Noted  . Full-term premature rupture of membranes with onset of labor within 24 hours of rupture 12/06/2016  . Indication for care in labor and delivery, antepartum 12/05/2016    Past Surgical History:  Procedure Laterality Date  . WISDOM TOOTH EXTRACTION     2014    Prior to Admission medications   Medication Sig Start Date End Date Taking? Authorizing Provider  amoxicillin (AMOXIL) 500 MG capsule Take 1 capsule (500 mg total) by mouth 3 (three) times daily. 12/30/18   Irean Hong, MD  ferrous sulfate 325 (65 FE) MG EC tablet Take 1 tablet (325 mg total) by mouth 3 (three) times daily with meals. 12/10/16 12/10/17  Sharee Pimple, CNM  magic mouthwash SOLN Take 5 mLs by mouth 3 (three) times  daily as needed for mouth pain. 12/30/18   Irean Hong, MD  Prenatal Multivit-Min-Fe-FA (PRENATAL VITAMINS) 0.8 MG tablet Take 1 tablet by mouth daily. 06/25/16   Schaevitz, Myra Rude, MD    Allergies Patient has no known allergies.  No family history on file.  Social History Social History   Tobacco Use  . Smoking status: Never Smoker  . Smokeless tobacco: Never Used  Substance Use Topics  . Alcohol use: No  . Drug use: No      Review of Systems Constitutional: No fever/chills Eyes: No visual changes. ENT: No sore throat. Cardiovascular: Denies chest pain. Respiratory: Denies shortness of breath. Gastrointestinal: Positive abdominal pain no nausea, no vomiting.  No diarrhea.  No constipation. Genitourinary: Negative for dysuria. Musculoskeletal: Negative for back pain. Skin: Negative for rash. Neurological: Negative for headaches, focal weakness or numbness. All other ROS negative ____________________________________________   PHYSICAL EXAM:  VITAL SIGNS: ED Triage Vitals  Enc Vitals Group     BP 08/13/19 0741 130/82     Pulse Rate 08/13/19 0741 80     Resp 08/13/19 0741 16     Temp 08/13/19 0741 97.6 F (36.4 C)     Temp Source 08/13/19 0741 Oral     SpO2 08/13/19 0741 95 %     Weight --      Height --      Head Circumference --  Peak Flow --      Pain Score 08/13/19 0737 9     Pain Loc --      Pain Edu? --      Excl. in [place]? --     Constitutional: Alert and oriented. Well appearing and in no acute distress. Eyes: Conjunctivae are normal. EOMI. Head: Atraumatic. Nose: No congestion/rhinnorhea. Mouth/Throat: Mucous membranes are moist.   Neck: No stridor. Trachea Midline. FROM Cardiovascular: Normal rate, regular rhythm. Grossly normal heart sounds.  Good peripheral circulation. Respiratory: Normal respiratory effort.  No retractions. Lungs CTAB. Gastrointestinal: Soft with suprapubic tenderness.  No distention. No abdominal bruits.    Musculoskeletal: No lower extremity tenderness nor edema.  No joint effusions. Neurologic:  Normal speech and language. No gross focal neurologic deficits are appreciated.  Skin:  Skin is warm, dry and intact. No rash noted. Psychiatric: Mood and affect are normal. Speech and behavior are normal. GU: Deferred   ____________________________________________   LABS (all labs ordered are listed, but only abnormal results are displayed)  Labs Reviewed  WET PREP, GENITAL - Abnormal; Notable for the following components:      Result Value   WBC, Wet Prep HPF POC MANY (*)    All other components within normal limits  CBC WITH DIFFERENTIAL/PLATELET - Abnormal; Notable for the following components:   MCV 79.6 (*)    MCH 25.8 (*)    All other components within normal limits  COMPREHENSIVE METABOLIC PANEL - Abnormal; Notable for the following components:   Glucose, Bld 107 (*)    Anion gap 16 (*)    All other components within normal limits  URINALYSIS, ROUTINE W REFLEX MICROSCOPIC - Abnormal; Notable for the following components:   Color, Urine YELLOW (*)    APPearance CLEAR (*)    Specific Gravity, Urine >1.046 (*)    All other components within normal limits  GC/CHLAMYDIA PROBE AMP  LIPASE, BLOOD  HCG, QUANTITATIVE, PREGNANCY  POC URINE PREG, ED   ____________________________________________  RADIOLOGY   Official radiology report(s): CT ABDOMEN PELVIS W CONTRAST  Result Date: 08/13/2019 CLINICAL DATA:  Severe lower abdominal pain EXAM: CT ABDOMEN AND PELVIS WITH CONTRAST TECHNIQUE: Multidetector CT imaging of the abdomen and pelvis was performed using the standard protocol following bolus administration of intravenous contrast. CONTRAST:  166mL OMNIPAQUE IOHEXOL 300 MG/ML  SOLN COMPARISON:  MRI abdomen/pelvis dated 06/25/2016 FINDINGS: Lower chest: Lung bases are clear. Hepatobiliary: Liver is within normal limits. Gallbladder is unremarkable. No intrahepatic or extrahepatic ductal  dilatation. Pancreas: Within normal limits. Spleen: Within normal limits. Adrenals/Urinary Tract: Adrenal glands are within normal limits. Kidneys are within normal limits.  No hydronephrosis. Bladder is within normal limits. Stomach/Bowel: Stomach is within normal limits. No evidence of bowel obstruction. Normal appendix (series 2/image 71). No colonic wall thickening or inflammatory changes. Vascular/Lymphatic: No evidence of abdominal aortic aneurysm. No suspicious abdominopelvic lymphadenopathy. Reproductive: Uterus is within normal limits. Bilateral ovaries are within normal limits. Other: No abdominopelvic ascites. Musculoskeletal: Visualized osseous structures are within normal limits. IMPRESSION: Negative CT abdomen/pelvis. Electronically Signed   By: Julian Hy M.D.   On: 08/13/2019 10:18    ____________________________________________   PROCEDURES  Procedure(s) performed (including Critical Care):  Procedures   ____________________________________________   INITIAL IMPRESSION / ASSESSMENT AND PLAN / ED COURSE  Janet Greer was evaluated in Emergency Department on 08/13/2019 for the symptoms described in the history of present illness. She was evaluated in the context of the global COVID-19 pandemic, which necessitated consideration that  the patient might be at risk for infection with the SARS-CoV-2 virus that causes COVID-19. Institutional protocols and algorithms that pertain to the evaluation of patients at risk for COVID-19 are in a state of rapid change based on information released by regulatory bodies including the CDC and federal and state organizations. These policies and algorithms were followed during the patient's care in the ED.    Patient is a well-appearing 24 year old who presents with sudden onset lower abdominal pain.  Will get pregnancy test to evaluate for pregnancy versus ectopic.  Given patient is tender over her bladder will get urine to evaluate for  UTI.  Will get labs to evaluate for electrolyte abnormalities, AKI.  Lower suspicion for cholecystitis or pancreatitis causing pain given the location of the pain.  Consider appendicitis but again her pain is more midline in the right lower quadrant.  Consider ovarian issues but does not sound like torsion.   Labs reviewed and are reassuring.  No white count elevation to suggest infection.  hCG level was negative so unlikely to be pregnancy.  Still pending UA.  Patient dates that she is unable to urinate.  Pelvic exam patient was slightly tender with some mild discharge.  Reevaluated patient and she states that her pain is still severe.  We discussed CT imaging and elected to proceed to rule out appendicitis or ovarian issues.  CT imaging was negative.  We will give some Tylenol and Toradol to help with the pain.  UA without evidence of UTI.  Discussed with patient given tenderness on her cervix we will treat for PID.  Patient is directed to avoid sex and to follow-up on her results to see if her partner needs treatment.  We discussed return precautions.  I discussed the provisional nature of ED diagnosis, the treatment so far, the ongoing plan of care, follow up appointments and return precautions with the patient and any family or support people present. They expressed understanding and agreed with the plan, discharged home.    ____________________________________________   FINAL CLINICAL IMPRESSION(S) / ED DIAGNOSES   Final diagnoses:  PID (acute pelvic inflammatory disease)      MEDICATIONS GIVEN DURING THIS VISIT:  Medications  cefTRIAXone (ROCEPHIN) injection 500 mg (has no administration in time range)  sodium chloride 0.9 % bolus 1,000 mL (0 mLs Intravenous Stopped 08/13/19 1245)  fentaNYL (SUBLIMAZE) injection 50 mcg (50 mcg Intravenous Given 08/13/19 0819)  ondansetron (ZOFRAN) injection 4 mg (4 mg Intravenous Given 08/13/19 0941)  iohexol (OMNIPAQUE) 300 MG/ML solution 100  mL (100 mLs Intravenous Contrast Given 08/13/19 1005)  acetaminophen (TYLENOL) tablet 650 mg (650 mg Oral Given 08/13/19 1142)  ketorolac (TORADOL) 30 MG/ML injection 15 mg (15 mg Intravenous Given 08/13/19 1137)  sodium chloride 0.9 % bolus 1,000 mL (1,000 mLs Intravenous New Bag/Given 08/13/19 1245)     ED Discharge Orders         Ordered    doxycycline (ADOXA) 100 MG tablet  2 times daily     08/13/19 1251           Note:  This document was prepared using Dragon voice recognition software and may include unintentional dictation errors.   Concha Se, MD 08/13/19 1252

## 2019-08-13 NOTE — ED Triage Notes (Signed)
Pt c/o lower abd pain for the past 1.5hrs. denies N/V/D.Marland Kitchen

## 2019-08-13 NOTE — ED Notes (Signed)
Dr. Funke at bedside. 

## 2019-08-13 NOTE — Discharge Instructions (Signed)
Your work-up was reassuring.  Your CT scan was negative.  Your urine was negative.  You did have some cervical motion tenderness so we are going to be safe and just treat you for possible pelvic inflammatory disease although it is impossible to know for sure if this is what you have.  Your STD testing will not come back for a few days.  You should not have any sex activity for 2 weeks.  You can follow-up with these results to see if your partner needs tested and treatment.  Return to the ER for fevers or any other concerns.

## 2019-08-16 LAB — GC/CHLAMYDIA PROBE AMP
Chlamydia trachomatis, NAA: POSITIVE — AB
Neisseria Gonorrhoeae by PCR: NEGATIVE

## 2019-08-17 ENCOUNTER — Telehealth: Payer: Self-pay | Admitting: Emergency Medicine

## 2019-08-17 NOTE — Telephone Encounter (Signed)
Called patient to assure she is aware of std results positive for chlamydia.  She was treated during visit as long as she is completing the docycycline.  Her phone says to try later and I cannot leave message.

## 2019-08-18 NOTE — Telephone Encounter (Signed)
Tried to contact patient by phone again with no success.  Will send a letter.

## 2019-11-15 ENCOUNTER — Emergency Department
Admission: EM | Admit: 2019-11-15 | Discharge: 2019-11-16 | Disposition: A | Discharge status: Home / Self Care | Payer: Medicaid Other | Attending: Emergency Medicine | Admitting: Emergency Medicine

## 2019-11-15 ENCOUNTER — Other Ambulatory Visit: Payer: Self-pay

## 2019-11-15 DIAGNOSIS — Z79899 Other long term (current) drug therapy: Secondary | ICD-10-CM | POA: Diagnosis not present

## 2019-11-15 DIAGNOSIS — N760 Acute vaginitis: Secondary | ICD-10-CM | POA: Insufficient documentation

## 2019-11-15 DIAGNOSIS — N730 Acute parametritis and pelvic cellulitis: Secondary | ICD-10-CM | POA: Diagnosis not present

## 2019-11-15 DIAGNOSIS — A64 Unspecified sexually transmitted disease: Secondary | ICD-10-CM

## 2019-11-15 DIAGNOSIS — B9689 Other specified bacterial agents as the cause of diseases classified elsewhere: Secondary | ICD-10-CM

## 2019-11-15 DIAGNOSIS — R102 Pelvic and perineal pain: Secondary | ICD-10-CM | POA: Diagnosis present

## 2019-11-15 LAB — COMPREHENSIVE METABOLIC PANEL
ALT: 20 U/L (ref 0–44)
AST: 21 U/L (ref 15–41)
Albumin: 3.9 g/dL (ref 3.5–5.0)
Alkaline Phosphatase: 68 U/L (ref 38–126)
Anion gap: 11 (ref 5–15)
BUN: 15 mg/dL (ref 6–20)
CO2: 22 mmol/L (ref 22–32)
Calcium: 9.1 mg/dL (ref 8.9–10.3)
Chloride: 107 mmol/L (ref 98–111)
Creatinine, Ser: 0.78 mg/dL (ref 0.44–1.00)
GFR calc Af Amer: >60 mL/min (ref >60)
GFR calc non Af Amer: >60 mL/min (ref >60)
Glucose, Bld: 127 mg/dL — ABNORMAL HIGH (ref 70–99)
Potassium: 3.6 mmol/L (ref 3.5–5.1)
Sodium: 140 mmol/L (ref 135–145)
Total Bilirubin: 0.6 mg/dL (ref 0.3–1.2)
Total Protein: 8 g/dL (ref 6.5–8.1)

## 2019-11-15 LAB — URINALYSIS, COMPLETE (UACMP) WITH MICROSCOPIC
Bilirubin Urine: NEGATIVE
Glucose, UA: NEGATIVE mg/dL
Hgb urine dipstick: NEGATIVE
Ketones, ur: NEGATIVE mg/dL
Nitrite: NEGATIVE
Protein, ur: 100 mg/dL — AB
RBC / HPF: >50 RBC/hpf — ABNORMAL HIGH (ref 0–5)
Specific Gravity, Urine: 1.032 — ABNORMAL HIGH (ref 1.005–1.030)
WBC, UA: >50 WBC/hpf — ABNORMAL HIGH (ref 0–5)
pH: 5 (ref 5.0–8.0)

## 2019-11-15 LAB — CBC
HCT: 38.1 % (ref 36.0–46.0)
Hemoglobin: 12.6 g/dL (ref 12.0–15.0)
MCH: 26.3 pg (ref 26.0–34.0)
MCHC: 33.1 g/dL (ref 30.0–36.0)
MCV: 79.4 fL — ABNORMAL LOW (ref 80.0–100.0)
Platelets: 264 10*3/uL (ref 150–400)
RBC: 4.8 MIL/uL (ref 3.87–5.11)
RDW: 14.1 % (ref 11.5–15.5)
WBC: 13 10*3/uL — ABNORMAL HIGH (ref 4.0–10.5)
nRBC: 0 % (ref 0.0–0.2)

## 2019-11-15 LAB — GROUP A STREP BY PCR: Group A Strep by PCR: NOT DETECTED

## 2019-11-15 LAB — PREGNANCY, URINE: Preg Test, Ur: NEGATIVE

## 2019-11-15 LAB — LIPASE, BLOOD: Lipase: 23 U/L (ref 11–51)

## 2019-11-15 MED ORDER — SODIUM CHLORIDE 0.9% FLUSH: 3.0000 mL | Freq: Once | INTRAVENOUS | Status: DC

## 2019-11-15 MED ORDER — CEFTRIAXONE SODIUM 250 MG IJ SOLR
250.0000 mg | Freq: Once | INTRAMUSCULAR | Status: AC
  Administered 2019-11-15: 250 mg via INTRAMUSCULAR
  Filled 2019-11-15: qty 250

## 2019-11-15 MED ORDER — AZITHROMYCIN 1 G PO PACK
1.0000 g | PACK | Freq: Once | ORAL | Status: AC
  Administered 2019-11-15: 1 g via ORAL
  Filled 2019-11-15: qty 1

## 2019-11-15 NOTE — ED Provider Notes (Signed)
[facility] Emergency Department Provider Note  ____________________________________________   First MD Initiated Contact with Patient 11/15/19 2318     (approximate)  I have reviewed the triage vital signs and the nursing notes.   HISTORY  Chief Complaint Abdominal Pain    HPI Janet Greer is a 24 y.o. female with below list of previous medical conditions including chlamydial infection earlier this year presents to the emergency department secondary to pelvic discomfort with yellowish-green vaginal discharge that is malodorous in nature.  Patient states that she had unprotected intercourse approximately 3 weeks ago and then subsequently week ago.  Patient denies any fever.  Patient denies any rash.  However in addition the patient does admit to sore throat with spots.  Patient does admit to dysuria as well         Past Medical History:  Diagnosis Date  . Complication of anesthesia   . PONV (postoperative nausea and vomiting)     Patient Active Problem List   Diagnosis Date Noted  . Full-term premature rupture of membranes with onset of labor within 24 hours of rupture 12/06/2016  . Indication for care in labor and delivery, antepartum 12/05/2016    Past Surgical History:  Procedure Laterality Date  . WISDOM TOOTH EXTRACTION     2014    Prior to Admission medications   Medication Sig Start Date End Date Taking? Authorizing Provider  amoxicillin (AMOXIL) 500 MG capsule Take 1 capsule (500 mg total) by mouth 3 (three) times daily. 12/30/18   Irean Hong, MD  ferrous sulfate 325 (65 FE) MG EC tablet Take 1 tablet (325 mg total) by mouth 3 (three) times daily with meals. 12/10/16 12/10/17  Sharee Pimple, CNM  magic mouthwash SOLN Take 5 mLs by mouth 3 (three) times daily as needed for mouth pain. 12/30/18   Irean Hong, MD  Prenatal Multivit-Min-Fe-FA (PRENATAL VITAMINS) 0.8 MG tablet Take 1 tablet by mouth daily. 06/25/16   Schaevitz, Myra Rude, MD    Allergies Patient has no known allergies.  No family history on file.  Social History Social History   Tobacco Use  . Smoking status: Never Smoker  . Smokeless tobacco: Never Used  Substance Use Topics  . Alcohol use: No  . Drug use: No    Review of Systems Constitutional: No fever/chills Eyes: No visual changes. ENT: Positive for sore throat. Cardiovascular: Denies chest pain. Respiratory: Denies shortness of breath. Gastrointestinal: No abdominal pain.  No nausea, no vomiting.  No diarrhea.  No constipation. Genitourinary: Positive for malodorous yellowish-green vaginal discharge Musculoskeletal: Negative for neck pain.  Negative for back pain. Integumentary: Negative for rash. Neurological: Negative for headaches, focal weakness or numbness.  ____________________________________________   PHYSICAL EXAM:  VITAL SIGNS: ED Triage Vitals [11/15/19 1950]  Enc Vitals Group     BP 128/88     Pulse Rate (!) 126     Resp 16     Temp 99.2 F (37.3 C)     Temp Source Oral     SpO2 98 %     Weight 90.7 kg (200 lb)     Height 1.524 m (5')     Head Circumference      Peak Flow      Pain Score 8     Pain Loc      Pain Edu?      Excl. in [place]?     Constitutional: Alert and oriented.  Eyes: Conjunctivae are normal.  Mouth/Throat: Patient is wearing a mask. Neck: No stridor.  No meningeal signs.   Cardiovascular: Normal rate, regular rhythm. Good peripheral circulation. Grossly normal heart sounds. Respiratory: Normal respiratory effort.  No retractions. Gastrointestinal: Soft and nontender. No distention.  Genitourinary: Copious yellowish-green vaginal discharge erythematous cervix with yellowish discharge from the cervical os.  Positive cervical motion tenderness. Musculoskeletal: No lower extremity tenderness nor edema. No gross deformities of extremities. Neurologic:  Normal speech and language. No gross focal neurologic deficits are appreciated.    Skin:  Skin is warm, dry and intact. Psychiatric: Mood and affect are normal. Speech and behavior are normal.  ____________________________________________   LABS (all labs ordered are listed, but only abnormal results are displayed)  Labs Reviewed  COMPREHENSIVE METABOLIC PANEL - Abnormal; Notable for the following components:      Result Value   Glucose, Bld 127 (*)    All other components within normal limits  CBC - Abnormal; Notable for the following components:   WBC 13.0 (*)    MCV 79.4 (*)    All other components within normal limits  URINALYSIS, COMPLETE (UACMP) WITH MICROSCOPIC - Abnormal; Notable for the following components:   Color, Urine YELLOW (*)    APPearance TURBID (*)    Specific Gravity, Urine 1.032 (*)    Protein, ur 100 (*)    Leukocytes,Ua LARGE (*)    RBC / HPF >50 (*)    WBC, UA >50 (*)    Bacteria, UA FEW (*)    All other components within normal limits  GROUP A STREP BY PCR  CHLAMYDIA/NGC RT PCR ([facility] ONLY)  WET PREP, GENITAL  LIPASE, BLOOD  PREGNANCY, URINE    Procedures   ____________________________________________   INITIAL IMPRESSION / MDM / ASSESSMENT AND PLAN / ED COURSE  As part of my medical decision making, I reviewed the following data within the electronic MEDICAL RECORD NUMBER   24 year old female presented with above-stated history and physical exam with differential diagnosis including but not limited to gonorrhea chlamydia PID bacterial vaginosis HIV, syphilis.  Patient given IV patient given IM ceftriaxone 2 and 50 mg, azithromycin 500 mg p.o. and doxycycline for home.  Patient also prescribed Flagyl secondary to evidence of BV  ____________________________________________  FINAL CLINICAL IMPRESSION(S) / ED DIAGNOSES  Final diagnoses:  BV (bacterial vaginosis)  STD (female)  PID (acute pelvic inflammatory disease)     MEDICATIONS GIVEN DURING THIS VISIT:  Medications  sodium chloride flush (NS) 0.9 %  injection 3 mL (has no administration in time range)     ED Discharge Orders    None      *Please note:  Janet Greer was evaluated in Emergency Department on 11/15/2019 for the symptoms described in the history of present illness. She was evaluated in the context of the global COVID-19 pandemic, which necessitated consideration that the patient might be at risk for infection with the SARS-CoV-2 virus that causes COVID-19. Institutional protocols and algorithms that pertain to the evaluation of patients at risk for COVID-19 are in a state of rapid change based on information released by regulatory bodies including the CDC and federal and state organizations. These policies and algorithms were followed during the patient's care in the ED.  Some ED evaluations and interventions may be delayed as a result of limited staffing during the pandemic.*  Note:  This document was prepared using Dragon voice recognition software and may include unintentional dictation errors.   Gregor Hams, MD 11/16/19 [phone]

## 2019-11-15 NOTE — ED Triage Notes (Signed)
Pt reporting intermittent lower abdominal pain for several weeks, associated with nausea. Has had yellowish/greenish vaginal discharge since the beginning of the week. LMP October (has Nexplanon). Pt says that since last since she has had sore throat with white patches. Denies fevers. No meds PTA.

## 2019-11-16 LAB — WET PREP, GENITAL
Sperm: NONE SEEN
Yeast Wet Prep HPF POC: NONE SEEN

## 2019-11-16 LAB — HIV ANTIBODY (ROUTINE TESTING W REFLEX): HIV Screen 4th Generation wRfx: NONREACTIVE

## 2019-11-16 LAB — RPR: RPR Ser Ql: NONREACTIVE

## 2019-11-16 LAB — CHLAMYDIA/NGC RT PCR (ARMC ONLY)
Chlamydia Tr: NOT DETECTED
N gonorrhoeae: NOT DETECTED

## 2019-11-16 MED ORDER — METRONIDAZOLE 500 MG PO TABS
500.0000 mg | ORAL_TABLET | Freq: Two times a day (BID) | ORAL | 0 refills | Status: AC
Start: 2019-11-16 — End: 2019-11-23

## 2019-11-16 MED ORDER — DOXYCYCLINE HYCLATE 100 MG PO CAPS: 100.0000 mg | ORAL_CAPSULE | Freq: Two times a day (BID) | ORAL | 0 refills | Status: AC

## 2020-01-19 ENCOUNTER — Other Ambulatory Visit: Payer: Self-pay

## 2020-01-19 ENCOUNTER — Emergency Department
Admission: EM | Admit: 2020-01-19 | Discharge: 2020-01-20 | Disposition: A | Discharge status: Home / Self Care | Payer: Medicaid Other | Attending: Emergency Medicine | Admitting: Emergency Medicine

## 2020-01-19 DIAGNOSIS — R1010 Upper abdominal pain, unspecified: Secondary | ICD-10-CM | POA: Diagnosis not present

## 2020-01-19 DIAGNOSIS — Z5321 Procedure and treatment not carried out due to patient leaving prior to being seen by health care provider: Secondary | ICD-10-CM | POA: Diagnosis not present

## 2020-01-19 LAB — URINALYSIS, COMPLETE (UACMP) WITH MICROSCOPIC
Bilirubin Urine: NEGATIVE
Glucose, UA: NEGATIVE mg/dL
Hgb urine dipstick: NEGATIVE
Ketones, ur: NEGATIVE mg/dL
Nitrite: NEGATIVE
Protein, ur: NEGATIVE mg/dL
Specific Gravity, Urine: 1.019 (ref 1.005–1.030)
pH: 6 (ref 5.0–8.0)

## 2020-01-19 LAB — COMPREHENSIVE METABOLIC PANEL
ALT: 22 U/L (ref 0–44)
AST: 22 U/L (ref 15–41)
Albumin: 4 g/dL (ref 3.5–5.0)
Alkaline Phosphatase: 65 U/L (ref 38–126)
Anion gap: 11 (ref 5–15)
BUN: 12 mg/dL (ref 6–20)
CO2: 23 mmol/L (ref 22–32)
Calcium: 9.1 mg/dL (ref 8.9–10.3)
Chloride: 107 mmol/L (ref 98–111)
Creatinine, Ser: 0.81 mg/dL (ref 0.44–1.00)
GFR calc Af Amer: >60 mL/min (ref >60)
GFR calc non Af Amer: >60 mL/min (ref >60)
Glucose, Bld: 108 mg/dL — ABNORMAL HIGH (ref 70–99)
Potassium: 3.7 mmol/L (ref 3.5–5.1)
Sodium: 141 mmol/L (ref 135–145)
Total Bilirubin: 0.5 mg/dL (ref 0.3–1.2)
Total Protein: 7.8 g/dL (ref 6.5–8.1)

## 2020-01-19 LAB — CBC
HCT: 39.2 % (ref 36.0–46.0)
Hemoglobin: 13.4 g/dL (ref 12.0–15.0)
MCH: 26.4 pg (ref 26.0–34.0)
MCHC: 34.2 g/dL (ref 30.0–36.0)
MCV: 77.3 fL — ABNORMAL LOW (ref 80.0–100.0)
Platelets: 264 10*3/uL (ref 150–400)
RBC: 5.07 MIL/uL (ref 3.87–5.11)
RDW: 14.2 % (ref 11.5–15.5)
WBC: 10.3 10*3/uL (ref 4.0–10.5)
nRBC: 0 % (ref 0.0–0.2)

## 2020-01-19 LAB — LIPASE, BLOOD: Lipase: 34 U/L (ref 11–51)

## 2020-01-19 LAB — POCT PREGNANCY, URINE: Preg Test, Ur: NEGATIVE

## 2020-01-19 NOTE — ED Triage Notes (Signed)
Patient reports upper abdominal pain that makes her rib cage hurt for the past 30 minutes.

## 2020-01-20 NOTE — ED Notes (Signed)
Called pt several times, RN walked around waiting room no answer, Rn walked outside calling for pt no answer

## 2020-10-05 ENCOUNTER — Encounter: Payer: Self-pay | Admitting: Emergency Medicine

## 2020-10-05 ENCOUNTER — Other Ambulatory Visit: Payer: Self-pay

## 2020-10-05 ENCOUNTER — Emergency Department
Admission: EM | Admit: 2020-10-05 | Discharge: 2020-10-05 | Disposition: A | Discharge status: Home / Self Care | Payer: Medicaid Other | Attending: Emergency Medicine | Admitting: Emergency Medicine

## 2020-10-05 ENCOUNTER — Emergency Department: Payer: Medicaid Other

## 2020-10-05 DIAGNOSIS — R109 Unspecified abdominal pain: Secondary | ICD-10-CM | POA: Insufficient documentation

## 2020-10-05 DIAGNOSIS — Z5321 Procedure and treatment not carried out due to patient leaving prior to being seen by health care provider: Secondary | ICD-10-CM | POA: Diagnosis not present

## 2020-10-05 DIAGNOSIS — R197 Diarrhea, unspecified: Secondary | ICD-10-CM | POA: Diagnosis not present

## 2020-10-05 DIAGNOSIS — R110 Nausea: Secondary | ICD-10-CM | POA: Diagnosis not present

## 2020-10-05 DIAGNOSIS — R079 Chest pain, unspecified: Secondary | ICD-10-CM | POA: Diagnosis not present

## 2020-10-05 LAB — BASIC METABOLIC PANEL
Anion gap: 9 (ref 5–15)
BUN: 13 mg/dL (ref 6–20)
CO2: 23 mmol/L (ref 22–32)
Calcium: 8.8 mg/dL — ABNORMAL LOW (ref 8.9–10.3)
Chloride: 103 mmol/L (ref 98–111)
Creatinine, Ser: 0.72 mg/dL (ref 0.44–1.00)
GFR, Estimated: >60 mL/min (ref >60)
Glucose, Bld: 89 mg/dL (ref 70–99)
Potassium: 3.7 mmol/L (ref 3.5–5.1)
Sodium: 135 mmol/L (ref 135–145)

## 2020-10-05 LAB — CBC
HCT: 39.6 % (ref 36.0–46.0)
Hemoglobin: 12.9 g/dL (ref 12.0–15.0)
MCH: 26.8 pg (ref 26.0–34.0)
MCHC: 32.6 g/dL (ref 30.0–36.0)
MCV: 82.2 fL (ref 80.0–100.0)
Platelets: 243 10*3/uL (ref 150–400)
RBC: 4.82 MIL/uL (ref 3.87–5.11)
RDW: 13.3 % (ref 11.5–15.5)
WBC: 7.9 10*3/uL (ref 4.0–10.5)
nRBC: 0 % (ref 0.0–0.2)

## 2020-10-05 LAB — LIPASE, BLOOD: Lipase: 29 U/L (ref 11–51)

## 2020-10-05 LAB — TROPONIN I (HIGH SENSITIVITY): Troponin I (High Sensitivity): <2 ng/L (ref <18)

## 2020-10-05 NOTE — ED Triage Notes (Signed)
Pt called from [facility] for VS reassessment and to be placed in treatment room, no respons

## 2020-10-05 NOTE — ED Triage Notes (Signed)
Pt called for VS reassessment to be taken to a treatment room, no repsonse

## 2020-10-05 NOTE — ED Triage Notes (Signed)
Pt called x's 3, no response ?

## 2020-10-05 NOTE — ED Notes (Signed)
Pt called x's 3, no response ?

## 2020-10-05 NOTE — ED Triage Notes (Addendum)
Pt in via POV, reports ongoing abdominal pain w/ diarrhea in addition to some generalized chest pain x a few days.  Ambulatory to triage, NAD noted at this time.

## 2021-02-20 ENCOUNTER — Other Ambulatory Visit: Payer: Self-pay

## 2021-02-20 ENCOUNTER — Encounter: Payer: Self-pay | Admitting: Emergency Medicine

## 2021-02-20 ENCOUNTER — Emergency Department
Admission: EM | Admit: 2021-02-20 | Discharge: 2021-02-20 | Disposition: A | Discharge status: Home / Self Care | Payer: Medicaid Other | Attending: Emergency Medicine | Admitting: Emergency Medicine

## 2021-02-20 ENCOUNTER — Emergency Department: Payer: Medicaid Other

## 2021-02-20 DIAGNOSIS — K2900 Acute gastritis without bleeding: Secondary | ICD-10-CM | POA: Diagnosis not present

## 2021-02-20 DIAGNOSIS — F17210 Nicotine dependence, cigarettes, uncomplicated: Secondary | ICD-10-CM | POA: Insufficient documentation

## 2021-02-20 DIAGNOSIS — R0602 Shortness of breath: Secondary | ICD-10-CM | POA: Diagnosis not present

## 2021-02-20 DIAGNOSIS — R1011 Right upper quadrant pain: Secondary | ICD-10-CM | POA: Diagnosis present

## 2021-02-20 DIAGNOSIS — E860 Dehydration: Secondary | ICD-10-CM | POA: Diagnosis not present

## 2021-02-20 LAB — COMPREHENSIVE METABOLIC PANEL
ALT: 18 U/L (ref 0–44)
AST: 15 U/L (ref 15–41)
Albumin: 3.6 g/dL (ref 3.5–5.0)
Alkaline Phosphatase: 51 U/L (ref 38–126)
Anion gap: 5 (ref 5–15)
BUN: 18 mg/dL (ref 6–20)
CO2: 25 mmol/L (ref 22–32)
Calcium: 8.9 mg/dL (ref 8.9–10.3)
Chloride: 108 mmol/L (ref 98–111)
Creatinine, Ser: 0.67 mg/dL (ref 0.44–1.00)
GFR, Estimated: >60 mL/min (ref >60)
Glucose, Bld: 109 mg/dL — ABNORMAL HIGH (ref 70–99)
Potassium: 4 mmol/L (ref 3.5–5.1)
Sodium: 138 mmol/L (ref 135–145)
Total Bilirubin: 0.5 mg/dL (ref 0.3–1.2)
Total Protein: 7.2 g/dL (ref 6.5–8.1)

## 2021-02-20 LAB — URINALYSIS, COMPLETE (UACMP) WITH MICROSCOPIC
Bilirubin Urine: NEGATIVE
Glucose, UA: NEGATIVE mg/dL
Hgb urine dipstick: NEGATIVE
Ketones, ur: NEGATIVE mg/dL
Nitrite: NEGATIVE
Protein, ur: NEGATIVE mg/dL
Specific Gravity, Urine: 1.027 (ref 1.005–1.030)
pH: 6 (ref 5.0–8.0)

## 2021-02-20 LAB — CBC
HCT: 37.8 % (ref 36.0–46.0)
Hemoglobin: 12.7 g/dL (ref 12.0–15.0)
MCH: 27.5 pg (ref 26.0–34.0)
MCHC: 33.6 g/dL (ref 30.0–36.0)
MCV: 82 fL (ref 80.0–100.0)
Platelets: 296 10*3/uL (ref 150–400)
RBC: 4.61 MIL/uL (ref 3.87–5.11)
RDW: 13 % (ref 11.5–15.5)
WBC: 11.1 10*3/uL — ABNORMAL HIGH (ref 4.0–10.5)
nRBC: 0 % (ref 0.0–0.2)

## 2021-02-20 LAB — LIPASE, BLOOD: Lipase: 32 U/L (ref 11–51)

## 2021-02-20 LAB — D-DIMER, QUANTITATIVE: D-Dimer, Quant: <0.27 ug/mL-FEU (ref 0.00–0.50)

## 2021-02-20 LAB — TROPONIN I (HIGH SENSITIVITY)
Troponin I (High Sensitivity): <2 ng/L (ref <18)
Troponin I (High Sensitivity): <2 ng/L (ref <18)

## 2021-02-20 LAB — POC URINE PREG, ED: Preg Test, Ur: NEGATIVE

## 2021-02-20 MED ORDER — LACTATED RINGERS IV BOLUS
1000.0000 mL | Freq: Once | INTRAVENOUS | Status: AC
  Administered 2021-02-20: 1000 mL via INTRAVENOUS

## 2021-02-20 MED ORDER — LORAZEPAM 2 MG/ML IJ SOLN
0.5000 mg | Freq: Once | INTRAMUSCULAR | Status: AC
  Administered 2021-02-20: 0.5 mg via INTRAVENOUS
  Filled 2021-02-20: qty 1

## 2021-02-20 MED ORDER — DICYCLOMINE HCL 10 MG PO CAPS
10.0000 mg | ORAL_CAPSULE | Freq: Once | ORAL | Status: AC
  Administered 2021-02-20: 10 mg via ORAL
  Filled 2021-02-20: qty 1

## 2021-02-20 MED ORDER — METOCLOPRAMIDE HCL 5 MG/ML IJ SOLN
10.0000 mg | Freq: Once | INTRAMUSCULAR | Status: AC
  Administered 2021-02-20: 10 mg via INTRAVENOUS
  Filled 2021-02-20: qty 2

## 2021-02-20 MED ORDER — ONDANSETRON 4 MG PO TBDP
4.0000 mg | ORAL_TABLET | Freq: Once | ORAL | Status: AC | PRN
  Administered 2021-02-20: 4 mg via ORAL
  Filled 2021-02-20: qty 1

## 2021-02-20 MED ORDER — DICYCLOMINE HCL 10 MG PO CAPS: 10.0000 mg | ORAL_CAPSULE | Freq: Four times a day (QID) | ORAL | 0 refills | Status: DC

## 2021-02-20 MED ORDER — IOHEXOL 350 MG/ML SOLN
100.0000 mL | Freq: Once | INTRAVENOUS | Status: AC | PRN
  Administered 2021-02-20: 100 mL via INTRAVENOUS

## 2021-02-20 MED ORDER — MORPHINE SULFATE (PF) 4 MG/ML IV SOLN
6.0000 mg | Freq: Once | INTRAVENOUS | Status: AC
  Administered 2021-02-20: 6 mg via INTRAVENOUS
  Filled 2021-02-20: qty 2

## 2021-02-20 MED ORDER — ONDANSETRON 4 MG PO TBDP: 4.0000 mg | ORAL_TABLET | Freq: Three times a day (TID) | ORAL | 0 refills | Status: DC | PRN

## 2021-02-20 MED ORDER — ONDANSETRON HCL 4 MG/2ML IJ SOLN
4.0000 mg | Freq: Once | INTRAMUSCULAR | Status: AC
  Administered 2021-02-20: 4 mg via INTRAVENOUS
  Filled 2021-02-20: qty 2

## 2021-02-20 NOTE — ED Provider Notes (Signed)
[facility] Emergency Department Provider Note  ____________________________________________   Event Date/Time   First MD Initiated Contact with Patient 02/20/21 0800     (approximate)  I have reviewed the triage vital signs and the nursing notes.   HISTORY  Chief Complaint Abdominal Pain and Shortness of Breath   HPI Janet Greer is a 25 y.o. female without significant past medical history who presents for assessment of some right upper quadrant abdominal pain rating to the back of her chest causing little shortness of breath associate with an episode of nonbloody nonbilious emesis.  He denies any fevers, cough, chest pain, left-sided or lower abdominal pain, diarrhea, vaginal bleeding or discharge, rash or extremity pain.  No significant EtOH or NSAID history.  No prior similar episodes.  No other acute concerns at this time.         Past Medical History:  Diagnosis Date   Complication of anesthesia    PONV (postoperative nausea and vomiting)     Patient Active Problem List   Diagnosis Date Noted   Full-term premature rupture of membranes with onset of labor within 24 hours of rupture 12/06/2016   Indication for care in labor and delivery, antepartum 12/05/2016    Past Surgical History:  Procedure Laterality Date   WISDOM TOOTH EXTRACTION     2014    Prior to Admission medications   Medication Sig Start Date End Date Taking? Authorizing Provider  dicyclomine (BENTYL) 10 MG capsule Take 1 capsule (10 mg total) by mouth 4 (four) times daily for 3 days. 02/20/21 02/23/21 Yes Gilles Chiquito, MD  ondansetron (ZOFRAN ODT) 4 MG disintegrating tablet Take 1 tablet (4 mg total) by mouth every 8 (eight) hours as needed for nausea or vomiting. 02/20/21  Yes Gilles Chiquito, MD  amoxicillin (AMOXIL) 500 MG capsule Take 1 capsule (500 mg total) by mouth 3 (three) times daily. 12/30/18   Irean Hong, MD  ferrous sulfate 325 (65 FE) MG EC tablet Take 1  tablet (325 mg total) by mouth 3 (three) times daily with meals. 12/10/16 12/10/17  Sharee Pimple, CNM  magic mouthwash SOLN Take 5 mLs by mouth 3 (three) times daily as needed for mouth pain. 12/30/18   Irean Hong, MD  Prenatal Multivit-Min-Fe-FA (PRENATAL VITAMINS) 0.8 MG tablet Take 1 tablet by mouth daily. 06/25/16   Schaevitz, Myra Rude, MD    Allergies Patient has no known allergies.  History reviewed. No pertinent family history.  Social History Social History   Tobacco Use   Smoking status: Every Day    Types: E-cigarettes   Smokeless tobacco: Never  Vaping Use   Vaping Use: Every day  Substance Use Topics   Alcohol use: No   Drug use: No    Review of Systems  Review of Systems  Constitutional:  Negative for chills and fever.  HENT:  Negative for sore throat.   Eyes:  Negative for pain.  Respiratory:  Positive for shortness of breath (pt attributes to her pain). Negative for cough and stridor.   Cardiovascular:  Negative for chest pain.  Gastrointestinal:  Positive for abdominal pain, nausea and vomiting.  Genitourinary:  Negative for dysuria.  Musculoskeletal:  Negative for myalgias.  Skin:  Negative for rash.  Neurological:  Negative for seizures, loss of consciousness and headaches.  Psychiatric/Behavioral:  Negative for suicidal ideas.   All other systems reviewed and are negative.    ____________________________________________   PHYSICAL EXAM:  VITAL SIGNS:  ED Triage Vitals [02/20/21 0210]  Enc Vitals Group     BP 114/71     Pulse Rate 79     Resp 18     Temp 98.2 F (36.8 C)     Temp Source Oral     SpO2 97 %     Weight 200 lb (90.7 kg)     Height 5' (1.524 m)     Head Circumference      Peak Flow      Pain Score 10     Pain Loc      Pain Edu?      Excl. in GC?    Vitals:   02/20/21 1200 02/20/21 1300  BP: (!) 98/53 118/87  Pulse: 63 64  Resp: 16 16  Temp:    SpO2: 94% 100%   Physical Exam Vitals and nursing note reviewed.   Constitutional:      General: She is not in acute distress.    Appearance: She is well-developed. She is ill-appearing.  HENT:     Head: Normocephalic and atraumatic.     Right Ear: External ear normal.     Left Ear: External ear normal.     Nose: Nose normal.  Eyes:     Conjunctiva/sclera: Conjunctivae normal.  Cardiovascular:     Rate and Rhythm: Normal rate and regular rhythm.     Heart sounds: No murmur heard. Pulmonary:     Effort: Pulmonary effort is normal. No respiratory distress.     Breath sounds: Normal breath sounds.  Abdominal:     Palpations: Abdomen is soft.     Tenderness: There is abdominal tenderness in the right upper quadrant. There is no right CVA tenderness or left CVA tenderness.  Musculoskeletal:     Cervical back: Neck supple.  Skin:    General: Skin is warm and dry.     Capillary Refill: Capillary refill takes less than 2 seconds.  Neurological:     Mental Status: She is alert and oriented to person, place, and time.  Psychiatric:        Mood and Affect: Mood normal.     ____________________________________________   LABS (all labs ordered are listed, but only abnormal results are displayed)  Labs Reviewed  COMPREHENSIVE METABOLIC PANEL - Abnormal; Notable for the following components:      Result Value   Glucose, Bld 109 (*)    All other components within normal limits  CBC - Abnormal; Notable for the following components:   WBC 11.1 (*)    All other components within normal limits  URINALYSIS, COMPLETE (UACMP) WITH MICROSCOPIC - Abnormal; Notable for the following components:   Color, Urine YELLOW (*)    APPearance HAZY (*)    Leukocytes,Ua SMALL (*)    Bacteria, UA RARE (*)    All other components within normal limits  LIPASE, BLOOD  D-DIMER, QUANTITATIVE  POC URINE PREG, ED  TROPONIN I (HIGH SENSITIVITY)  TROPONIN I (HIGH SENSITIVITY)   ____________________________________________  EKG  Sinus rhythm with a ventricular to 78,  normal axis, unremarkable intervals without clearance of acute ischemia or significant arrhythmia. ____________________________________________  RADIOLOGY  ED MD interpretation: Chest x-ray shows no focal consolidation, effusion, edema, pneumothorax or other clear acute intrathoracic process.  Right upper quadrant ultrasound has no evidence of gallstones, cholecystitis or dilated common bile duct.  CT abdomen pelvis shows no evidence of pancreatitis, diverticulitis, appendicitis, kidney stone, pyelonephritis or other clear acute abdominopelvic process.  Adnexa are unremarkable.  Official  radiology report(s): DG Chest 2 View  Result Date: 02/20/2021 CLINICAL DATA:  25 year old female with shortness of breath EXAM: CHEST - 2 VIEW COMPARISON:  Chest radiograph dated 10/05/2020. FINDINGS: The heart size and mediastinal contours are within normal limits. Both lungs are clear. The visualized skeletal structures are unremarkable. IMPRESSION: No active cardiopulmonary disease. Electronically Signed   By: Elgie Collard M.D.   On: 02/20/2021 02:39   CT ABDOMEN PELVIS W CONTRAST  Result Date: 02/20/2021 CLINICAL DATA:  Abdominal pain EXAM: CT ABDOMEN AND PELVIS WITH CONTRAST TECHNIQUE: Multidetector CT imaging of the abdomen and pelvis was performed using the standard protocol following bolus administration of intravenous contrast. CONTRAST:  OMNIPAQUE IOHEXOL 350 MG/ML SOLN COMPARISON:  CT abdomen and pelvis dated August 13, 2019 FINDINGS: Lower chest: No acute abnormality. Hepatobiliary: No focal liver abnormality is seen. No gallstones, gallbladder wall thickening, or biliary dilatation. Pancreas: Unremarkable. No pancreatic ductal dilatation or surrounding inflammatory changes. Spleen: Normal in size without focal abnormality. Adrenals/Urinary Tract: Adrenal glands are unremarkable. Kidneys are normal, without renal calculi, focal lesion, or hydronephrosis. Bladder is unremarkable. Stomach/Bowel:  Stomach is within normal limits. Appendix is not visualized, although there are no secondary findings acute appendicitis. Scattered colonic diverticula. No evidence of bowel wall thickening, distention, or inflammatory changes. Vascular/Lymphatic: No significant vascular findings are present. No enlarged abdominal or pelvic lymph nodes. Reproductive: Uterus and bilateral adnexa are unremarkable. Other: No abdominal wall hernia or abnormality. No abdominopelvic ascites. Musculoskeletal: No acute or significant osseous findings. IMPRESSION: No acute CT findings in the abdomen or pelvis to explain abdominal pain. Electronically Signed   By: Allegra Lai MD   On: 02/20/2021 12:53   US ABDOMEN LIMITED RUQ (LIVER/GB)  Result Date: 02/20/2021 CLINICAL DATA:  Right upper quadrant pain EXAM: ULTRASOUND ABDOMEN LIMITED RIGHT UPPER QUADRANT COMPARISON:  CT abdomen and pelvis dated August 13, 2019 FINDINGS: Gallbladder: No gallstones or wall thickening visualized. No sonographic Murphy sign noted by sonographer. Common bile duct: Diameter: 3 mm Liver: No focal lesion identified. Increased parenchymal echogenicity. Portal vein is patent on color Doppler imaging with normal direction of blood flow towards the liver. Other: None. IMPRESSION: No sonographic evidence of acute cholecystitis. Hepatic steatosis. Electronically Signed   By: Allegra Lai MD   On: 02/20/2021 09:52    ____________________________________________   PROCEDURES  Procedure(s) performed (including Critical Care):  .1-3 Lead EKG Interpretation  Date/Time: 02/20/2021 1:41 PM Performed by: Gilles Chiquito, MD Authorized by: Gilles Chiquito, MD     Interpretation: normal     ECG rate assessment: normal     Rhythm: sinus rhythm     Ectopy: none     Conduction: normal     ____________________________________________   INITIAL IMPRESSION / ASSESSMENT AND PLAN / ED COURSE      Presents with above to history exam for assessment  of some right upper quadrant epigastric abdominal pain associate with some shortness of breath nausea and vomiting.  On arrival she is afebrile and hemodynamically stable.  She is fairly tender in epigastrium and right upper quadrant.  Differential includes cholecystitis, pancreatitis, acute gastritis, kidney stone, PE, pneumonia possible cystitis.  ECG and nonelevated troponins are not suggestive of atypical presentation for ACS.  Chest x-ray has no evidence of pneumonia.  Right upper quadrant ultrasound with evidence of cholecystitis or dilated common bile duct.  CT abdomen pelvis shows no evidence of pancreatitis, diverticulitis, appendicitis, kidney stone, pyelonephritis or other clear acute abdominopelvic process.  Adnexa are unremarkable.  D-dimer is undetectable and not consistent with acute PE.  Pregnancy test is negative.  After receiving several doses of antiemetics patient on my reassessment is able to tolerate p.o. and states her pain is significantly improved.  I suspect likely acute infectious gastroenteritis given otherwise reassuring vitals, labs and CT.  Given patient is now tolerating p.o. I think she is stable for discharge with close outpatient follow-up.  Will write Rx for Zofran and Bentyl.  Discharged stable condition.  Strict return precautions advised and discussed.   ____________________________________________   FINAL CLINICAL IMPRESSION(S) / ED DIAGNOSES  Final diagnoses:  RUQ pain  Acute gastritis without hemorrhage, unspecified gastritis type  Dehydration    Medications  ondansetron (ZOFRAN-ODT) disintegrating tablet 4 mg (4 mg Oral Given 02/20/21 0833)  lactated ringers bolus 1,000 mL (0 mLs Intravenous Stopped 02/20/21 0932)  morphine 4 MG/ML injection 6 mg (6 mg Intravenous Given 02/20/21 0835)  ondansetron (ZOFRAN) injection 4 mg (4 mg Intravenous Given 02/20/21 0946)  lactated ringers bolus 1,000 mL (0 mLs Intravenous Stopped 02/20/21 1101)  LORazepam (ATIVAN)  injection 0.5 mg (0.5 mg Intravenous Given 02/20/21 1135)  dicyclomine (BENTYL) capsule 10 mg (10 mg Oral Given 02/20/21 1253)  iohexol (OMNIPAQUE) 350 MG/ML injection 100 mL (100 mLs Intravenous Contrast Given 02/20/21 1217)  metoCLOPramide (REGLAN) injection 10 mg (10 mg Intravenous Given 02/20/21 1315)     ED Discharge Orders          Ordered    ondansetron (ZOFRAN ODT) 4 MG disintegrating tablet  Every 8 hours PRN        02/20/21 1338    dicyclomine (BENTYL) 10 MG capsule  4 times daily        02/20/21 1338             Note:  This document was prepared using Dragon voice recognition software and may include unintentional dictation errors.    Gilles ChiquitoSmith, Tamesha Ellerbrock P, MD 02/20/21 1341

## 2021-02-20 NOTE — ED Triage Notes (Signed)
Pt arrived via POV with reports of RUQ abd pain radiating to back. PT states the pain started this morning. Pt also c/o shortness of breath states it is painful to take in a deep breath.   Pt denies any injury. No respiratory distress noted.  Pt ambulatory without difficulty.

## 2021-05-09 ENCOUNTER — Other Ambulatory Visit: Payer: Self-pay

## 2021-05-09 ENCOUNTER — Encounter: Payer: Self-pay | Admitting: Emergency Medicine

## 2021-05-09 ENCOUNTER — Emergency Department
Admission: EM | Admit: 2021-05-09 | Discharge: 2021-05-09 | Disposition: A | Discharge status: Home / Self Care | Payer: Medicaid Other | Attending: Emergency Medicine | Admitting: Emergency Medicine

## 2021-05-09 DIAGNOSIS — Z5321 Procedure and treatment not carried out due to patient leaving prior to being seen by health care provider: Secondary | ICD-10-CM | POA: Insufficient documentation

## 2021-05-09 DIAGNOSIS — R112 Nausea with vomiting, unspecified: Secondary | ICD-10-CM | POA: Insufficient documentation

## 2021-05-09 DIAGNOSIS — R1030 Lower abdominal pain, unspecified: Secondary | ICD-10-CM | POA: Insufficient documentation

## 2021-05-09 LAB — CBC WITH DIFFERENTIAL/PLATELET
Abs Immature Granulocytes: 0.03 10*3/uL (ref 0.00–0.07)
Basophils Absolute: 0 10*3/uL (ref 0.0–0.1)
Basophils Relative: 0 %
Eosinophils Absolute: 0.1 10*3/uL (ref 0.0–0.5)
Eosinophils Relative: 1 %
HCT: 38.8 % (ref 36.0–46.0)
Hemoglobin: 13 g/dL (ref 12.0–15.0)
Immature Granulocytes: 0 %
Lymphocytes Relative: 23 %
Lymphs Abs: 2.1 10*3/uL (ref 0.7–4.0)
MCH: 27.5 pg (ref 26.0–34.0)
MCHC: 33.5 g/dL (ref 30.0–36.0)
MCV: 82.2 fL (ref 80.0–100.0)
Monocytes Absolute: 0.6 10*3/uL (ref 0.1–1.0)
Monocytes Relative: 6 %
Neutro Abs: 6.2 10*3/uL (ref 1.7–7.7)
Neutrophils Relative %: 70 %
Platelets: 309 10*3/uL (ref 150–400)
RBC: 4.72 MIL/uL (ref 3.87–5.11)
RDW: 13.1 % (ref 11.5–15.5)
WBC: 9 10*3/uL (ref 4.0–10.5)
nRBC: 0 % (ref 0.0–0.2)

## 2021-05-09 LAB — URINALYSIS, ROUTINE W REFLEX MICROSCOPIC
Bilirubin Urine: NEGATIVE
Glucose, UA: NEGATIVE mg/dL
Hgb urine dipstick: NEGATIVE
Ketones, ur: NEGATIVE mg/dL
Nitrite: NEGATIVE
Protein, ur: NEGATIVE mg/dL
Specific Gravity, Urine: 1.025 (ref 1.005–1.030)
pH: 6 (ref 5.0–8.0)

## 2021-05-09 LAB — COMPREHENSIVE METABOLIC PANEL
ALT: 22 U/L (ref 0–44)
AST: 16 U/L (ref 15–41)
Albumin: 3.8 g/dL (ref 3.5–5.0)
Alkaline Phosphatase: 49 U/L (ref 38–126)
Anion gap: 7 (ref 5–15)
BUN: 20 mg/dL (ref 6–20)
CO2: 24 mmol/L (ref 22–32)
Calcium: 8.7 mg/dL — ABNORMAL LOW (ref 8.9–10.3)
Chloride: 109 mmol/L (ref 98–111)
Creatinine, Ser: 0.87 mg/dL (ref 0.44–1.00)
GFR, Estimated: >60 mL/min (ref >60)
Glucose, Bld: 111 mg/dL — ABNORMAL HIGH (ref 70–99)
Potassium: 4 mmol/L (ref 3.5–5.1)
Sodium: 140 mmol/L (ref 135–145)
Total Bilirubin: 0.3 mg/dL (ref 0.3–1.2)
Total Protein: 7.5 g/dL (ref 6.5–8.1)

## 2021-05-09 LAB — LIPASE, BLOOD: Lipase: 36 U/L (ref 11–51)

## 2021-05-09 NOTE — ED Notes (Signed)
Pt not visualized in the WR at this time

## 2021-05-09 NOTE — ED Notes (Signed)
Called times 3   no answer in lobby  this nurse called her   she went home

## 2021-05-09 NOTE — ED Triage Notes (Signed)
Patient ambulatory to triage with steady gait, without difficulty or distress noted; pt reports lower abd pain radiating around into back accomp by N/V x 3hrs

## 2021-06-22 ENCOUNTER — Encounter: Payer: Self-pay | Admitting: Emergency Medicine

## 2021-06-22 ENCOUNTER — Emergency Department
Admission: EM | Admit: 2021-06-22 | Discharge: 2021-06-22 | Disposition: A | Discharge status: Home / Self Care | Payer: Medicaid Other | Attending: Emergency Medicine | Admitting: Emergency Medicine

## 2021-06-22 ENCOUNTER — Emergency Department: Payer: Medicaid Other

## 2021-06-22 ENCOUNTER — Other Ambulatory Visit: Payer: Self-pay

## 2021-06-22 DIAGNOSIS — R1011 Right upper quadrant pain: Secondary | ICD-10-CM

## 2021-06-22 DIAGNOSIS — Z20822 Contact with and (suspected) exposure to covid-19: Secondary | ICD-10-CM | POA: Insufficient documentation

## 2021-06-22 DIAGNOSIS — K8020 Calculus of gallbladder without cholecystitis without obstruction: Secondary | ICD-10-CM | POA: Diagnosis not present

## 2021-06-22 DIAGNOSIS — F17290 Nicotine dependence, other tobacco product, uncomplicated: Secondary | ICD-10-CM | POA: Diagnosis not present

## 2021-06-22 LAB — COMPREHENSIVE METABOLIC PANEL
ALT: 19 U/L (ref 0–44)
AST: 21 U/L (ref 15–41)
Albumin: 3.7 g/dL (ref 3.5–5.0)
Alkaline Phosphatase: 52 U/L (ref 38–126)
Anion gap: 7 (ref 5–15)
BUN: 17 mg/dL (ref 6–20)
CO2: 21 mmol/L — ABNORMAL LOW (ref 22–32)
Calcium: 8.6 mg/dL — ABNORMAL LOW (ref 8.9–10.3)
Chloride: 108 mmol/L (ref 98–111)
Creatinine, Ser: 0.75 mg/dL (ref 0.44–1.00)
GFR, Estimated: >60 mL/min (ref >60)
Glucose, Bld: 111 mg/dL — ABNORMAL HIGH (ref 70–99)
Potassium: 3.9 mmol/L (ref 3.5–5.1)
Sodium: 136 mmol/L (ref 135–145)
Total Bilirubin: 0.4 mg/dL (ref 0.3–1.2)
Total Protein: 7.4 g/dL (ref 6.5–8.1)

## 2021-06-22 LAB — CBC
HCT: 39.7 % (ref 36.0–46.0)
Hemoglobin: 13.2 g/dL (ref 12.0–15.0)
MCH: 26.7 pg (ref 26.0–34.0)
MCHC: 33.2 g/dL (ref 30.0–36.0)
MCV: 80.4 fL (ref 80.0–100.0)
Platelets: 308 10*3/uL (ref 150–400)
RBC: 4.94 MIL/uL (ref 3.87–5.11)
RDW: 12.5 % (ref 11.5–15.5)
WBC: 8.2 10*3/uL (ref 4.0–10.5)
nRBC: 0 % (ref 0.0–0.2)

## 2021-06-22 LAB — URINALYSIS, ROUTINE W REFLEX MICROSCOPIC
Bilirubin Urine: NEGATIVE
Glucose, UA: NEGATIVE mg/dL
Hgb urine dipstick: NEGATIVE
Nitrite: NEGATIVE
Protein, ur: NEGATIVE mg/dL
Specific Gravity, Urine: >1.03 — ABNORMAL HIGH (ref 1.005–1.030)
pH: 6.5 (ref 5.0–8.0)

## 2021-06-22 LAB — POC URINE PREG, ED: Preg Test, Ur: NEGATIVE

## 2021-06-22 LAB — LIPASE, BLOOD: Lipase: 31 U/L (ref 11–51)

## 2021-06-22 LAB — RESP PANEL BY RT-PCR (FLU A&B, COVID) ARPGX2
Influenza A by PCR: NEGATIVE
Influenza B by PCR: NEGATIVE
SARS Coronavirus 2 by RT PCR: NEGATIVE

## 2021-06-22 MED ORDER — ONDANSETRON 4 MG PO TBDP
4.0000 mg | ORAL_TABLET | Freq: Once | ORAL | Status: AC
  Administered 2021-06-22: 11:00:00 4 mg via ORAL
  Filled 2021-06-22: qty 1

## 2021-06-22 MED ORDER — METOCLOPRAMIDE HCL 10 MG PO TABS
10.0000 mg | ORAL_TABLET | Freq: Once | ORAL | Status: AC
  Administered 2021-06-22: 11:00:00 10 mg via ORAL
  Filled 2021-06-22: qty 1

## 2021-06-22 MED ORDER — TRAMADOL HCL 50 MG PO TABS: 50.0000 mg | ORAL_TABLET | Freq: Four times a day (QID) | ORAL | 0 refills | Status: DC | PRN

## 2021-06-22 MED ORDER — HYOSCYAMINE SULFATE SL 0.125 MG SL SUBL: 0.1250 mg | SUBLINGUAL_TABLET | Freq: Four times a day (QID) | SUBLINGUAL | 0 refills | Status: DC | PRN

## 2021-06-22 NOTE — ED Notes (Signed)
Dc ppw provided. Pt given followup information and RX information. Pt denies any questions and provides verbal consent for dc.

## 2021-06-22 NOTE — ED Triage Notes (Signed)
Pt reports pain to her right upper abd that radiates into her back and nausea that started around 0400 this am. Pt reports she gets sweaty as well. Pt denies pain that radiates elsewhere. Pt reports has vomited several times.

## 2021-06-22 NOTE — ED Provider Notes (Signed)
[facility] Emergency Department Provider Note ____________________________________________   Event Date/Time   First MD Initiated Contact with Patient 06/22/21 [phone]     (approximate)  I have reviewed the triage vital signs and the nursing notes.   HISTORY  Chief Complaint Abdominal Pain and Nausea  HPI Janet Greer is a 25 y.o. female with history of right upper abdominal pain that radiates into her back that is also associated with nausea.  Symptoms started around 4:00 this morning.  She has had 4-5 episodes of vomiting with the last being 1 hour prior to ER room assignment.  No fever but she has had chills.  No cough, diarrhea, dysuria.        Past Medical History:  Diagnosis Date   Complication of anesthesia    PONV (postoperative nausea and vomiting)     Patient Active Problem List   Diagnosis Date Noted   Full-term premature rupture of membranes with onset of labor within 24 hours of rupture 12/06/2016   Indication for care in labor and delivery, antepartum 12/05/2016    Past Surgical History:  Procedure Laterality Date   WISDOM TOOTH EXTRACTION     2014    Prior to Admission medications   Medication Sig Start Date End Date Taking? Authorizing Provider  Hyoscyamine Sulfate SL (LEVSIN/SL) 0.125 MG SUBL Place 0.125 mg under the tongue 4 (four) times daily as needed. 06/22/21  Yes Moe Brier B, FNP  traMADol (ULTRAM) 50 MG tablet Take 1 tablet (50 mg total) by mouth every 6 (six) hours as needed. 06/22/21  Yes Traycen Goyer B, FNP  amoxicillin (AMOXIL) 500 MG capsule Take 1 capsule (500 mg total) by mouth 3 (three) times daily. 12/30/18   Irean Hong, MD  dicyclomine (BENTYL) 10 MG capsule Take 1 capsule (10 mg total) by mouth 4 (four) times daily for 3 days. 02/20/21 02/23/21  Gilles Chiquito, MD  ferrous sulfate 325 (65 FE) MG EC tablet Take 1 tablet (325 mg total) by mouth 3 (three) times daily with meals. 12/10/16 12/10/17  Sharee Pimple, CNM  magic mouthwash SOLN Take 5 mLs by mouth 3 (three) times daily as needed for mouth pain. 12/30/18   Irean Hong, MD  ondansetron (ZOFRAN ODT) 4 MG disintegrating tablet Take 1 tablet (4 mg total) by mouth every 8 (eight) hours as needed for nausea or vomiting. 02/20/21   Gilles Chiquito, MD  Prenatal Multivit-Min-Fe-FA (PRENATAL VITAMINS) 0.8 MG tablet Take 1 tablet by mouth daily. 06/25/16   Schaevitz, Myra Rude, MD    Allergies Morphine and related  No family history on file.  Social History Social History   Tobacco Use   Smoking status: Every Day    Types: E-cigarettes   Smokeless tobacco: Never  Vaping Use   Vaping Use: Every day  Substance Use Topics   Alcohol use: No   Drug use: No    Review of Systems  Constitutional: No fever/positive for chills Eyes: No visual changes. ENT: No sore throat. Cardiovascular: Denies chest pain. Respiratory: Denies shortness of breath. Gastrointestinal: Positive for right upper quadrant abdominal pain with nausea and vomiting.   Genitourinary: Negative for dysuria. Musculoskeletal: Positive for back pain. Skin: Negative for rash. Neurological: Negative for headaches, focal weakness or numbness  ____________________________________________   PHYSICAL EXAM:  VITAL SIGNS: ED Triage Vitals  Enc Vitals Group     BP 06/22/21 0821 (!) 104/58     Pulse Rate 06/22/21 0821 64  Resp 06/22/21 0821 20     Temp 06/22/21 0821 97.6 F (36.4 C)     Temp Source 06/22/21 0821 Oral     SpO2 06/22/21 0821 99 %     Weight 06/22/21 0818 202 lb 13.2 oz (92 kg)     Height 06/22/21 0818 5' (1.524 m)     Head Circumference --      Peak Flow --      Pain Score 06/22/21 0818 10     Pain Loc --      Pain Edu? --      Excl. in GC? --     Constitutional: Alert and oriented. Well appearing and in no acute distress. Eyes: Conjunctivae are normal. PERRL. EOMI. Head: Atraumatic. Nose: No congestion/rhinnorhea. Mouth/Throat:  Mucous membranes are moist.   Neck: No stridor.   Hematological/Lymphatic/Immunilogical: No cervical lymphadenopathy. Cardiovascular: Normal rate, regular rhythm. Grossly normal heart sounds.  Good peripheral circulation. Respiratory: Normal respiratory effort.  No retractions. Lungs CTAB. Gastrointestinal: Soft and tender over the right upper quadrant r. No distention. No abdominal bruits.  Musculoskeletal: No lower extremity tenderness nor edema.  No joint effusions. Neurologic:  Normal speech and language. No gross focal neurologic deficits are appreciated. No gait instability. Skin:  Skin is warm, dry and intact. No rash noted. Psychiatric: Mood and affect are normal. Speech and behavior are normal.  ____________________________________________   LABS (all labs ordered are listed, but only abnormal results are displayed)  Labs Reviewed  COMPREHENSIVE METABOLIC PANEL - Abnormal; Notable for the following components:      Result Value   CO2 21 (*)    Glucose, Bld 111 (*)    Calcium 8.6 (*)    All other components within normal limits  URINALYSIS, ROUTINE W REFLEX MICROSCOPIC - Abnormal; Notable for the following components:   APPearance HAZY (*)    Specific Gravity, Urine >1.030 (*)    Ketones, ur TRACE (*)    Leukocytes,Ua SMALL (*)    Bacteria, UA RARE (*)    All other components within normal limits  RESP PANEL BY RT-PCR (FLU A&B, COVID) ARPGX2  LIPASE, BLOOD  CBC  POC URINE PREG, ED   ____________________________________________  EKG  Not indicated ____________________________________________  RADIOLOGY  ED MD interpretation:    Ultrasound of the right upper quadrant shows evidence of cholelithiasis without acute cholecystitis.  I, Kem Boroughs, personally viewed and evaluated these images (plain radiographs) as part of my medical decision making, as well as reviewing the written report by the radiologist.  Official radiology report(s): US Abdomen Limited RUQ  (LIVER/GB)  Result Date: 06/22/2021 CLINICAL DATA:  Right upper quadrant pain EXAM: ULTRASOUND ABDOMEN LIMITED RIGHT UPPER QUADRANT COMPARISON:  None. FINDINGS: Gallbladder: Gallstones. No bladder thickening visualized. No sonographic Murphy sign noted by sonographer. Common bile duct: Diameter: 3 mm Liver: No focal lesion identified. Increased parenchymal echogenicity. Portal vein is patent on color Doppler imaging with normal direction of blood flow towards the liver. Other: None. IMPRESSION: 1. Cholelithiasis without sonographic evidence of acute cholecystitis. 2. Hepatic steatosis. Electronically Signed   By: Jearld Lesch M.D.   On: 06/22/2021 10:40    ____________________________________________   PROCEDURES  Procedure(s) performed (including Critical Care):  Procedures  ____________________________________________   INITIAL IMPRESSION / ASSESSMENT AND PLAN     25 year old female presenting to the emergency department for treatment and evaluation of right upper quadrant pain.  See HPI for further details.  Will review labs, get ultrasound, and test for flu.  DIFFERENTIAL  DIAGNOSIS  Cholelithiasis, acute cholecystitis, COVID, influenza  ED COURSE  Labs are overall reassuring.  Urine does show trace ketones, small amount of leukocytes, rare bacteria with 11-20 squamous epithelial cells.  Patient is not having any urinary symptoms.  Will culture but not treat empirically at this time.  Ultrasound of the right upper quadrant shows cholelithiasis without acute cholecystitis.  Influenza and COVID are negative.  Plan will be to treat her with Levsin and tramadol.  She will be given information for general surgery as well.  ER return precautions discussed.     As part of my medical decision making, I reviewed the following data within the electronic MEDICAL RECORD NUMBER Labs reviewed, Old chart reviewed, and [place] Controlled Substance  Database  ___________________________________________   FINAL CLINICAL IMPRESSION(S) / ED DIAGNOSES  Final diagnoses:  RUQ pain  Calculus of gallbladder without cholecystitis without obstruction     ED Discharge Orders          Ordered    Hyoscyamine Sulfate SL (LEVSIN/SL) 0.125 MG SUBL  4 times daily PRN        06/22/21 1132    traMADol (ULTRAM) 50 MG tablet  Every 6 hours PRN        06/22/21 1132             Janet Greer was evaluated in Emergency Department on 06/22/2021 for the symptoms described in the history of present illness. She was evaluated in the context of the global COVID-19 pandemic, which necessitated consideration that the patient might be at risk for infection with the SARS-CoV-2 virus that causes COVID-19. Institutional protocols and algorithms that pertain to the evaluation of patients at risk for COVID-19 are in a state of rapid change based on information released by regulatory bodies including the CDC and federal and state organizations. These policies and algorithms were followed during the patient's care in the ED.   Note:  This document was prepared using Dragon voice recognition software and may include unintentional dictation errors.    Chinita Pester, FNP 06/22/21 1133    Minna Antis, MD 06/22/21 1456

## 2021-06-26 ENCOUNTER — Ambulatory Visit: Payer: Self-pay | Admitting: General Surgery

## 2021-06-26 NOTE — H&P (View-Only) (Signed)
PATIENT PROFILE: Janet Greer is a 25 y.o. female who presents to the Clinic for consultation at the request of Dr. Paduchowski for evaluation of cholelithiasis.  PCP:  Covington, Sarah, PA  HISTORY OF PRESENT ILLNESS: Janet Greer reports she started having abdominal pain on Sunday.  The pain was initially mild but it started increasing in intensity.  The pain was localized to the right upper quadrant.  Pain radiated to her back.  She has had previous episode like this before.  She feels that they were exacerbated by spicy food.  There has been no alleviating factors.  At the ED she was found with normal white blood cell count, normal bilirubin and normal alkaline phosphatase.  Abdominal ultrasound showed cholelithiasis without sign of cholecystitis.  I personally evaluated the images of the ultrasound.  She denies any fever or chills.   PROBLEM LIST: Problem List  Date Reviewed: 01/03/2021          Noted   Pelvic pain in female Unknown   Full-term premature rupture of membranes with onset of labor within 24 hours of rupture 12/06/2016   Indication for care in labor and delivery, antepartum 12/05/2016   Uterine size date discrepancy pregnancy Unknown   Elevated transaminase level 05/27/2016   Overview    Per Dr Schermerhorn, continue to observe pt      Rubella non-immune status, antepartum 05/25/2016   Overview    Give post partum.      Rh negative, antepartum 05/25/2016   Overview    Rhogam at 28 wks.       GENERAL REVIEW OF SYSTEMS:   General ROS: negative for - chills, fatigue, fever, weight gain or weight loss Allergy and Immunology ROS: negative for - hives  Hematological and Lymphatic ROS: negative for - bleeding problems or bruising, negative for palpable nodes Endocrine ROS: negative for - heat or cold intolerance, hair changes Respiratory ROS: negative for - cough, shortness of breath or wheezing Cardiovascular ROS: no chest pain or palpitations GI ROS: negative for  nausea, vomiting, diarrhea, constipation.  Positive for abdominal pain Musculoskeletal ROS: negative for - joint swelling or muscle pain Neurological ROS: negative for - confusion, syncope Dermatological ROS: negative for pruritus and rash Psychiatric: negative for anxiety, depression, difficulty sleeping and memory loss  MEDICATIONS: Current Outpatient Medications  Medication Sig Dispense Refill   etonogestrel (NEXPLANON) 68 mg implant Inject 1 each into the skin once.     traMADoL (ULTRAM) 50 mg tablet Take 1 tablet (50 mg total) by mouth every 6 (six) hours as needed     busPIRone (BUSPAR) 5 MG tablet Take 1 tablet (5 mg total) by mouth 2 (two) times daily (Patient not taking: Reported on 01/03/2021) 60 tablet 11   clonazePAM (KLONOPIN) 0.5 MG tablet Take 1 tablet (0.5 mg total) by mouth 2 (two) times daily as needed for Anxiety for up to 6 doses Take 1 hour before the procedure (Patient not taking: Reported on 01/03/2021) 6 tablet 0   escitalopram oxalate (LEXAPRO) 5 MG tablet Take 1 tablet (5 mg total) by mouth once daily (Patient not taking: Reported on 01/03/2021) 90 tablet 3   norgestimate-ethinyl estradioL (SPRINTEC 0.25/35, 28,) 0.25-35 mg-mcg tablet Take 1 tablet by mouth once daily (Patient not taking: Reported on 06/26/2021) 84 tablet 0   ondansetron (ZOFRAN-ODT) 4 MG disintegrating tablet Take 1 tablet (4 mg total) by mouth every 8 (eight) hours as needed for Nausea for up to 30 doses (Patient not taking: Reported on 01/03/2021) 30   tablet 0   No current facility-administered medications for this visit.    ALLERGIES: Morphine  PAST MEDICAL HISTORY: History reviewed. No pertinent past medical history.  PAST SURGICAL HISTORY: Past Surgical History:  Procedure Laterality Date   EXTRACTION TEETH     wisdom     FAMILY HISTORY: Family History  Problem Relation Age of Onset   High blood pressure (Hypertension) Father      SOCIAL HISTORY: Social History   Socioeconomic  History   Marital status: Single  Tobacco Use   Smoking status: Never   Smokeless tobacco: Never  Substance and Sexual Activity   Alcohol use: No   Drug use: No   Sexual activity: Yes    Partners: Male    Birth control/protection: None    PHYSICAL EXAM: Vitals:   06/26/21 0805  BP: 127/87  Pulse: 85   Body mass index is 38.08 kg/m. Weight: 88.5 kg (195 lb)   GENERAL: Alert, active, oriented x3  HEENT: Pupils equal reactive to light. Extraocular movements are intact. Sclera clear. Palpebral conjunctiva normal red color.Pharynx clear.  NECK: Supple with no palpable mass and no adenopathy.  LUNGS: Sound clear with no rales rhonchi or wheezes.  HEART: Regular rhythm S1 and S2 without murmur.  ABDOMEN: Soft and depressible, nontender with no palpable mass, no hepatomegaly.   EXTREMITIES: Well-developed well-nourished symmetrical with no dependent edema.  NEUROLOGICAL: Awake alert oriented, facial expression symmetrical, moving all extremities.  REVIEW OF DATA: I have reviewed the following data today: No visits with results within 3 Month(s) from this visit.  Latest known visit with results is:  Office Visit on 07/12/2017  Component Date Value   TB Skin Test 07/12/2017 Negative    Induration 07/12/2017 0      ASSESSMENT: Janet Greer is a 25 y.o. female presenting for consultation for cholelithiasis.    Patient was oriented about the diagnosis of cholelithiasis. Also oriented about what is the gallbladder, its anatomy and function and the implications of having stones. The patient was oriented about the treatment alternatives (observation vs cholecystectomy). Patient was oriented that a low percentage of patient will continue to have similar pain symptoms even after the gallbladder is removed. Surgical technique (open vs laparoscopic) was discussed. It was also discussed the goals of the surgery (decrease the pain episodes and avoid the risk of cholecystitis) and the risk  of surgery including: bleeding, infection, common bile duct injury, stone retention, injury to other organs such as bowel, liver, stomach, other complications such as hernia, bowel obstruction among others. Also discussed with patient about anesthesia and its complications such as: reaction to medications, pneumonia, heart complications, death, among others.   Cholelithiasis without cholecystitis [K80.20]  PLAN: 1.  Robotic assisted laparoscopic cholecystectomy (60454) 2.  CBC, CMP (done) 3.  Do not take aspirin 5 days before the procedure 4.  Contact us if has any question or concern.   Patient verbalized understanding, all questions were answered, and were agreeable with the plan outlined above.     Carolan Shiver, MD  Electronically signed by Carolan Shiver, MD

## 2021-06-26 NOTE — H&P (Signed)
PATIENT PROFILE: Janet Greer is a 25 y.o. female who presents to the Clinic for consultation at the request of Dr. Lenard Lance for evaluation of cholelithiasis.  PCP:  Clent Jacks, PA  HISTORY OF PRESENT ILLNESS: Janet Greer reports she started having abdominal pain on Sunday.  The pain was initially mild but it started increasing in intensity.  The pain was localized to the right upper quadrant.  Pain radiated to her back.  She has had previous episode like this before.  She feels that they were exacerbated by spicy food.  There has been no alleviating factors.  At the ED she was found with normal white blood cell count, normal bilirubin and normal alkaline phosphatase.  Abdominal ultrasound showed cholelithiasis without sign of cholecystitis.  I personally evaluated the images of the ultrasound.  She denies any fever or chills.   PROBLEM LIST: Problem List  Date Reviewed: 01/03/2021          Noted   Pelvic pain in female Unknown   Full-term premature rupture of membranes with onset of labor within 24 hours of rupture 12/06/2016   Indication for care in labor and delivery, antepartum 12/05/2016   Uterine size date discrepancy pregnancy Unknown   Elevated transaminase level 05/27/2016   Overview    Per Dr Feliberto Gottron, continue to observe pt      Rubella non-immune status, antepartum 05/25/2016   Overview    Give post partum.      Rh negative, antepartum 05/25/2016   Overview    Rhogam at 28 wks.       GENERAL REVIEW OF SYSTEMS:   General ROS: negative for - chills, fatigue, fever, weight gain or weight loss Allergy and Immunology ROS: negative for - hives  Hematological and Lymphatic ROS: negative for - bleeding problems or bruising, negative for palpable nodes Endocrine ROS: negative for - heat or cold intolerance, hair changes Respiratory ROS: negative for - cough, shortness of breath or wheezing Cardiovascular ROS: no chest pain or palpitations GI ROS: negative for  nausea, vomiting, diarrhea, constipation.  Positive for abdominal pain Musculoskeletal ROS: negative for - joint swelling or muscle pain Neurological ROS: negative for - confusion, syncope Dermatological ROS: negative for pruritus and rash Psychiatric: negative for anxiety, depression, difficulty sleeping and memory loss  MEDICATIONS: Current Outpatient Medications  Medication Sig Dispense Refill   etonogestrel (NEXPLANON) 68 mg implant Inject 1 each into the skin once.     traMADoL (ULTRAM) 50 mg tablet Take 1 tablet (50 mg total) by mouth every 6 (six) hours as needed     busPIRone (BUSPAR) 5 MG tablet Take 1 tablet (5 mg total) by mouth 2 (two) times daily (Patient not taking: Reported on 01/03/2021) 60 tablet 11   clonazePAM (KLONOPIN) 0.5 MG tablet Take 1 tablet (0.5 mg total) by mouth 2 (two) times daily as needed for Anxiety for up to 6 doses Take 1 hour before the procedure (Patient not taking: Reported on 01/03/2021) 6 tablet 0   escitalopram oxalate (LEXAPRO) 5 MG tablet Take 1 tablet (5 mg total) by mouth once daily (Patient not taking: Reported on 01/03/2021) 90 tablet 3   norgestimate-ethinyl estradioL (SPRINTEC 0.25/35, 28,) 0.25-35 mg-mcg tablet Take 1 tablet by mouth once daily (Patient not taking: Reported on 06/26/2021) 84 tablet 0   ondansetron (ZOFRAN-ODT) 4 MG disintegrating tablet Take 1 tablet (4 mg total) by mouth every 8 (eight) hours as needed for Nausea for up to 30 doses (Patient not taking: Reported on 01/03/2021) 30  tablet 0   No current facility-administered medications for this visit.    ALLERGIES: Morphine  PAST MEDICAL HISTORY: History reviewed. No pertinent past medical history.  PAST SURGICAL HISTORY: Past Surgical History:  Procedure Laterality Date   EXTRACTION TEETH     wisdom     FAMILY HISTORY: Family History  Problem Relation Age of Onset   High blood pressure (Hypertension) Father      SOCIAL HISTORY: Social History   Socioeconomic  History   Marital status: Single  Tobacco Use   Smoking status: Never   Smokeless tobacco: Never  Substance and Sexual Activity   Alcohol use: No   Drug use: No   Sexual activity: Yes    Partners: Male    Birth control/protection: None    PHYSICAL EXAM: Vitals:   06/26/21 0805  BP: 127/87  Pulse: 85   Body mass index is 38.08 kg/m. Weight: 88.5 kg (195 lb)   GENERAL: Alert, active, oriented x3  HEENT: Pupils equal reactive to light. Extraocular movements are intact. Sclera clear. Palpebral conjunctiva normal red color.Pharynx clear.  NECK: Supple with no palpable mass and no adenopathy.  LUNGS: Sound clear with no rales rhonchi or wheezes.  HEART: Regular rhythm S1 and S2 without murmur.  ABDOMEN: Soft and depressible, nontender with no palpable mass, no hepatomegaly.   EXTREMITIES: Well-developed well-nourished symmetrical with no dependent edema.  NEUROLOGICAL: Awake alert oriented, facial expression symmetrical, moving all extremities.  REVIEW OF DATA: I have reviewed the following data today: No visits with results within 3 Month(s) from this visit.  Latest known visit with results is:  Office Visit on 07/12/2017  Component Date Value   TB Skin Test 07/12/2017 Negative    Induration 07/12/2017 0      ASSESSMENT: Janet Greer is a 25 y.o. female presenting for consultation for cholelithiasis.    Patient was oriented about the diagnosis of cholelithiasis. Also oriented about what is the gallbladder, its anatomy and function and the implications of having stones. The patient was oriented about the treatment alternatives (observation vs cholecystectomy). Patient was oriented that a low percentage of patient will continue to have similar pain symptoms even after the gallbladder is removed. Surgical technique (open vs laparoscopic) was discussed. It was also discussed the goals of the surgery (decrease the pain episodes and avoid the risk of cholecystitis) and the risk  of surgery including: bleeding, infection, common bile duct injury, stone retention, injury to other organs such as bowel, liver, stomach, other complications such as hernia, bowel obstruction among others. Also discussed with patient about anesthesia and its complications such as: reaction to medications, pneumonia, heart complications, death, among others.   Cholelithiasis without cholecystitis [K80.20]  PLAN: 1.  Robotic assisted laparoscopic cholecystectomy (73220) 2.  CBC, CMP (done) 3.  Do not take aspirin 5 days before the procedure 4.  Contact us if has any question or concern.   Patient verbalized understanding, all questions were answered, and were agreeable with the plan outlined above.     Carolan Shiver, MD  Electronically signed by Carolan Shiver, MD

## 2021-06-29 ENCOUNTER — Encounter: Payer: Self-pay | Admitting: Emergency Medicine

## 2021-06-29 ENCOUNTER — Emergency Department
Admission: EM | Admit: 2021-06-29 | Discharge: 2021-06-29 | Disposition: A | Discharge status: Home / Self Care | Payer: Medicaid Other | Attending: Emergency Medicine | Admitting: Emergency Medicine

## 2021-06-29 ENCOUNTER — Emergency Department: Payer: Medicaid Other

## 2021-06-29 ENCOUNTER — Other Ambulatory Visit: Payer: Self-pay

## 2021-06-29 DIAGNOSIS — F17290 Nicotine dependence, other tobacco product, uncomplicated: Secondary | ICD-10-CM | POA: Diagnosis not present

## 2021-06-29 DIAGNOSIS — K8050 Calculus of bile duct without cholangitis or cholecystitis without obstruction: Secondary | ICD-10-CM | POA: Insufficient documentation

## 2021-06-29 DIAGNOSIS — R1011 Right upper quadrant pain: Secondary | ICD-10-CM

## 2021-06-29 LAB — CBC
HCT: 39.9 % (ref 36.0–46.0)
Hemoglobin: 13.2 g/dL (ref 12.0–15.0)
MCH: 27.1 pg (ref 26.0–34.0)
MCHC: 33.1 g/dL (ref 30.0–36.0)
MCV: 81.9 fL (ref 80.0–100.0)
Platelets: 277 10*3/uL (ref 150–400)
RBC: 4.87 MIL/uL (ref 3.87–5.11)
RDW: 12.5 % (ref 11.5–15.5)
WBC: 8.2 10*3/uL (ref 4.0–10.5)
nRBC: 0 % (ref 0.0–0.2)

## 2021-06-29 LAB — URINALYSIS, ROUTINE W REFLEX MICROSCOPIC
Bilirubin Urine: NEGATIVE
Glucose, UA: NEGATIVE mg/dL
Hgb urine dipstick: NEGATIVE
Ketones, ur: NEGATIVE mg/dL
Nitrite: NEGATIVE
Protein, ur: NEGATIVE mg/dL
Specific Gravity, Urine: 1.026 (ref 1.005–1.030)
pH: 6 (ref 5.0–8.0)

## 2021-06-29 LAB — COMPREHENSIVE METABOLIC PANEL
ALT: 17 U/L (ref 0–44)
AST: 16 U/L (ref 15–41)
Albumin: 3.8 g/dL (ref 3.5–5.0)
Alkaline Phosphatase: 58 U/L (ref 38–126)
Anion gap: 8 (ref 5–15)
BUN: 14 mg/dL (ref 6–20)
CO2: 24 mmol/L (ref 22–32)
Calcium: 9 mg/dL (ref 8.9–10.3)
Chloride: 105 mmol/L (ref 98–111)
Creatinine, Ser: 0.59 mg/dL (ref 0.44–1.00)
GFR, Estimated: >60 mL/min (ref >60)
Glucose, Bld: 100 mg/dL — ABNORMAL HIGH (ref 70–99)
Potassium: 4.3 mmol/L (ref 3.5–5.1)
Sodium: 137 mmol/L (ref 135–145)
Total Bilirubin: 0.5 mg/dL (ref 0.3–1.2)
Total Protein: 7.8 g/dL (ref 6.5–8.1)

## 2021-06-29 LAB — LIPASE, BLOOD: Lipase: 31 U/L (ref 11–51)

## 2021-06-29 LAB — POC URINE PREG, ED: Preg Test, Ur: NEGATIVE

## 2021-06-29 MED ORDER — SODIUM CHLORIDE 0.9 % IV BOLUS
1000.0000 mL | Freq: Once | INTRAVENOUS | Status: AC
  Administered 2021-06-29: 1000 mL via INTRAVENOUS

## 2021-06-29 MED ORDER — OXYCODONE-ACETAMINOPHEN 5-325 MG PO TABS: 1.0000 | ORAL_TABLET | ORAL | 0 refills | Status: AC | PRN

## 2021-06-29 MED ORDER — MORPHINE SULFATE (PF) 4 MG/ML IV SOLN
4.0000 mg | Freq: Once | INTRAVENOUS | Status: AC
  Administered 2021-06-29: 4 mg via INTRAVENOUS
  Filled 2021-06-29: qty 1

## 2021-06-29 MED ORDER — ONDANSETRON HCL 4 MG/2ML IJ SOLN
4.0000 mg | Freq: Once | INTRAMUSCULAR | Status: AC
  Administered 2021-06-29: 4 mg via INTRAVENOUS
  Filled 2021-06-29: qty 2

## 2021-06-29 NOTE — ED Provider Notes (Signed)
[facility] Emergency Department Provider Note   ____________________________________________   Event Date/Time   First MD Initiated Contact with Patient 06/29/21 1134     (approximate)  I have reviewed the triage vital signs and the nursing notes.   HISTORY  Chief Complaint Abdominal Pain and Back Pain    HPI Janet Greer is a 25 y.o. female with no significant past medical history presents to the ED complaining of abdominal and flank pain.  Patient reports that she has been dealing with intermittent pain in the right upper quadrant of her abdomen radiating around towards her right flank for the past couple of months.  She was seen in the ED about 1 week ago and told that she has gallstones, now has cholecystectomy scheduled in 8 days.  She states that she continues to have flareups of right upper quadrant pain multiple times per week with symptoms lasting for 5 to 8 hours.  This is a sharp pain that is not exacerbated or alleviated by anything, is often associated with nausea and vomiting along with diarrhea.  She denies any fevers, dysuria, hematuria, cough, chest pain, shortness of breath.  She has been taking tramadol and Zofran with partial relief of symptoms.  Current episode started around 3 AM this morning and has been persistent since then.        Past Medical History:  Diagnosis Date   Complication of anesthesia    PONV (postoperative nausea and vomiting)     Patient Active Problem List   Diagnosis Date Noted   Full-term premature rupture of membranes with onset of labor within 24 hours of rupture 12/06/2016   Indication for care in labor and delivery, antepartum 12/05/2016    Past Surgical History:  Procedure Laterality Date   WISDOM TOOTH EXTRACTION     2014    Prior to Admission medications   Medication Sig Start Date End Date Taking? Authorizing Provider  oxyCODONE-acetaminophen (PERCOCET) 5-325 MG tablet Take 1 tablet by  mouth every 4 (four) hours as needed for severe pain. 06/29/21 06/29/22 Yes Blake Divine, MD  etonogestrel (NEXPLANON) 68 MG IMPL implant 1 each by Subdermal route once.    [provider]  traMADol (ULTRAM) 50 MG tablet Take 1 tablet (50 mg total) by mouth every 6 (six) hours as needed. 06/22/21   Triplett, Johnette Abraham B, FNP    Allergies Morphine and related  No family history on file.  Social History Social History   Tobacco Use   Smoking status: Every Day    Types: E-cigarettes   Smokeless tobacco: Never  Vaping Use   Vaping Use: Every day  Substance Use Topics   Alcohol use: No   Drug use: No    Review of Systems  Constitutional: No fever/chills Eyes: No visual changes. ENT: No sore throat. Cardiovascular: Denies chest pain. Respiratory: Denies shortness of breath. Gastrointestinal: Positive for flank and abdominal pain.  Positive for nausea, vomiting, and diarrhea.  No constipation. Genitourinary: Negative for dysuria. Musculoskeletal: Negative for back pain. Skin: Negative for rash. Neurological: Negative for headaches, focal weakness or numbness.  ____________________________________________   PHYSICAL EXAM:  VITAL SIGNS: ED Triage Vitals  Enc Vitals Group     BP 06/29/21 0943 117/68     Pulse Rate 06/29/21 0943 66     Resp 06/29/21 0943 16     Temp 06/29/21 0943 97.8 F (36.6 C)     Temp src --      SpO2 06/29/21 0943 99 %  Weight 06/29/21 0829 200 lb (90.7 kg)     Height 06/29/21 0829 5' (1.524 m)     Head Circumference --      Peak Flow --      Pain Score 06/29/21 0829 10     Pain Loc --      Pain Edu? --      Excl. in GC? --     Constitutional: Alert and oriented. Eyes: Conjunctivae are normal. Head: Atraumatic. Nose: No congestion/rhinnorhea. Mouth/Throat: Mucous membranes are moist. Neck: Normal ROM Cardiovascular: Normal rate, regular rhythm. Grossly normal heart sounds.  2+ radial pulses bilaterally. Respiratory: Normal  respiratory effort.  No retractions. Lungs CTAB. Gastrointestinal: Soft and tender to palpation in the right upper quadrant with no rebound or guarding.  No CVA tenderness noted. No distention. Genitourinary: deferred Musculoskeletal: No lower extremity tenderness nor edema. Neurologic:  Normal speech and language. No gross focal neurologic deficits are appreciated. Skin:  Skin is warm, dry and intact. No rash noted. Psychiatric: Mood and affect are normal. Speech and behavior are normal.  ____________________________________________   LABS (all labs ordered are listed, but only abnormal results are displayed)  Labs Reviewed  COMPREHENSIVE METABOLIC PANEL - Abnormal; Notable for the following components:      Result Value   Glucose, Bld 100 (*)    All other components within normal limits  URINALYSIS, ROUTINE W REFLEX MICROSCOPIC - Abnormal; Notable for the following components:   Color, Urine YELLOW (*)    APPearance HAZY (*)    Leukocytes,Ua TRACE (*)    Bacteria, UA RARE (*)    All other components within normal limits  LIPASE, BLOOD  CBC  POC URINE PREG, ED    PROCEDURES  Procedure(s) performed (including Critical Care):  Procedures   ____________________________________________   INITIAL IMPRESSION / ASSESSMENT AND PLAN / ED COURSE      25 year old female with past medical history of gallstones who presents to the ED with right upper quadrant pain radiating to her right flank starting around 3 AM this morning and unrelenting since then.  Pain is reproducible with palpation of her right upper quadrant and seems consistent with biliary colic versus cholecystitis.  Labs are unremarkable, LFTs and lipase within normal limits and no reason to suspect choledocholithiasis.  Pregnancy testing is negative and UA shows no signs of infection.  We will recheck right upper quadrant ultrasound, treat symptomatically with IV morphine and Zofran, reassess following imaging.  Right  upper quadrant ultrasound shows cholelithiasis with no evidence of cholecystitis.  On reassessment, patient reports pain and nausea are much improved.  She is appropriate for outpatient follow-up, has surgery scheduled in 8 days.  We will prescribe short course of Percocet as tramadol has had minimal effect on her pain.  She states she has plenty of nausea medicine available at home and she was counseled to return to the ED for new worsening symptoms.  Patient agrees with plan.      ____________________________________________   FINAL CLINICAL IMPRESSION(S) / ED DIAGNOSES  Final diagnoses:  RUQ pain  Biliary colic     ED Discharge Orders          Ordered    oxyCODONE-acetaminophen (PERCOCET) 5-325 MG tablet  Every 4 hours PRN        06/29/21 1349             Note:  This document was prepared using Dragon voice recognition software and may include unintentional dictation errors.    Chesley Noon,  MD 06/29/21 1351

## 2021-06-29 NOTE — ED Triage Notes (Signed)
Pt reports pain to her right side upper and and NV for months. Pt states was seen last weekend for the same and told it was her gallbladder.

## 2021-06-29 NOTE — ED Notes (Signed)
Pt to ED for abdominal pain, seen several days ago here diagnosed with gallstones, made appt for surgery which is for next week. Pt states pain is worse. NVD since 0200 this morning. 8/10 sharp pain also felt in back. Pt ambulatory to ED room.

## 2021-06-29 NOTE — ED Notes (Addendum)
Pt to US.

## 2021-07-02 ENCOUNTER — Other Ambulatory Visit: Payer: Self-pay

## 2021-07-02 ENCOUNTER — Encounter
Admission: RE | Admit: 2021-07-02 | Discharge: 2021-07-02 | Disposition: A | Discharge status: Home / Self Care | Payer: Medicaid Other | Source: Ambulatory Visit | Attending: General Surgery | Admitting: General Surgery

## 2021-07-02 HISTORY — DX: Anemia, unspecified: D64.9

## 2021-07-02 HISTORY — DX: Gastro-esophageal reflux disease without esophagitis: K21.9

## 2021-07-02 HISTORY — DX: Headache, unspecified: R51.9

## 2021-07-02 NOTE — Patient Instructions (Addendum)
Your procedure is scheduled on:07-07-21 Monday Report to the Registration Desk on the 1st floor of the [facility].Then proceed to the 2nd floor Surgery Desk in the Medical [facility] To find out your arrival time, please call [phone] between 1PM - 3PM on:07-04-21 Friday  REMEMBER: Instructions that are not followed completely may result in serious medical risk, up to and including death; or upon the discretion of your surgeon and anesthesiologist your surgery may need to be rescheduled.  Do not eat food after midnight the night before surgery.  No gum chewing, lozengers or hard candies.  You may however, drink CLEAR liquids up to 2 hours before you are scheduled to arrive for your surgery. Do not drink anything within 2 hours of your scheduled arrival time.  Clear liquids include: - water  - apple juice without pulp - gatorade (not RED, PURPLE, OR BLUE) - black coffee or tea (Do NOT add milk or creamers to the coffee or tea) Do NOT drink anything that is not on this list.  TAKE THESE MEDICATIONS THE MORNING OF SURGERY WITH A SIP OF WATER: -You may take Percocet or Tramadol for pain if needed  One week prior to surgery: Stop Anti-inflammatories (NSAIDS) such as Advil, Aleve, Ibuprofen, Motrin, Naproxen, Naprosyn and Aspirin based products such as Excedrin, Goodys Powder, BC Powder.You may however, take Tylenol/Percocet/Tramadol if needed for pain up until the day of surgery.  Stop ANY OVER THE COUNTER supplements/vitamins NOW (07-02-21) until after surgery (Pepto-Bismol)  No Alcohol for 24 hours before or after surgery.  No Smoking including e-cigarettes for 24 hours prior to surgery.  No chewable tobacco products for at least 6 hours prior to surgery.  No nicotine patches on the day of surgery.  Do not use any "recreational" drugs for at least a week prior to your surgery.  Please be advised that the combination of cocaine and anesthesia may have negative outcomes, up to and  including death. If you test positive for cocaine, your surgery will be cancelled.  On the morning of surgery brush your teeth with toothpaste and water, you may rinse your mouth with mouthwash if you wish. Do not swallow any toothpaste or mouthwash.  Use CHG Soap directed on instruction sheet.  Do not wear jewelry, make-up, hairpins, clips or nail polish.  Do not wear lotions, powders, or perfumes.   Do not shave body from the neck down 48 hours prior to surgery just in case you cut yourself which could leave a site for infection.  Also, freshly shaved skin may become irritated if using the CHG soap.  Contact lenses, hearing aids and dentures may not be worn into surgery.  Do not bring valuables to the hospital. [facility] is not responsible for any missing/lost belongings or valuables.   Notify your doctor if there is any change in your medical condition (cold, fever, infection).  Wear comfortable clothing (specific to your surgery type) to the hospital.  After surgery, you can help prevent lung complications by doing breathing exercises.  Take deep breaths and cough every 1-2 hours. Your doctor may order a device called an Incentive Spirometer to help you take deep breaths. When coughing or sneezing, hold a pillow firmly against your incision with both hands. This is called splinting. Doing this helps protect your incision. It also decreases belly discomfort.  If you are being admitted to the hospital overnight, leave your suitcase in the car. After surgery it may be brought to your room.  If you  are being discharged the day of surgery, you will not be allowed to drive home. You will need a responsible adult (18 years or older) to drive you home and stay with you that night.   If you are taking public transportation, you will need to have a responsible adult (18 years or older) with you. Please confirm with your physician that it is acceptable to use public transportation.    Please call the Pre-admissions Testing Dept. at [phone] if you have any questions about these instructions.  Surgery Visitation Policy:  Patients undergoing a surgery or procedure may have one family member or support person with them as long as that person is not COVID-19 positive or experiencing its symptoms.  That person may remain in the waiting area during the procedure and may rotate out with other people.  Inpatient Visitation:    Visiting hours are 7 a.m. to 8 p.m. Up to two visitors ages 16+ are allowed at one time in a patient room. The visitors may rotate out with other people during the day. Visitors must check out when they leave, or other visitors will not be allowed. One designated support person may remain overnight. The visitor must pass COVID-19 screenings, use hand sanitizer when entering and exiting the patients room and wear a mask at all times, including in the patients room. Patients must also wear a mask when staff or their visitor are in the room. Masking is required regardless of vaccination status.

## 2021-07-07 ENCOUNTER — Encounter
Admission: RE | Disposition: A | Discharge status: Home / Self Care | Payer: Self-pay | Source: Home / Self Care | Attending: General Surgery

## 2021-07-07 ENCOUNTER — Ambulatory Visit
Admission: RE | Admit: 2021-07-07 | Discharge: 2021-07-07 | Disposition: A | Discharge status: Home / Self Care | Payer: Medicaid Other | Attending: General Surgery | Admitting: General Surgery

## 2021-07-07 ENCOUNTER — Encounter: Payer: Self-pay | Admitting: General Surgery

## 2021-07-07 ENCOUNTER — Ambulatory Visit: Payer: Medicaid Other | Admitting: Certified Registered Nurse Anesthetist

## 2021-07-07 ENCOUNTER — Other Ambulatory Visit: Payer: Self-pay

## 2021-07-07 DIAGNOSIS — K8010 Calculus of gallbladder with chronic cholecystitis without obstruction: Secondary | ICD-10-CM | POA: Diagnosis not present

## 2021-07-07 LAB — POCT PREGNANCY, URINE: Preg Test, Ur: NEGATIVE

## 2021-07-07 SURGERY — CHOLECYSTECTOMY, ROBOT-ASSISTED, LAPAROSCOPIC
Anesthesia: General | Site: Abdomen

## 2021-07-07 MED ORDER — MIDAZOLAM HCL 2 MG/2ML IJ SOLN
INTRAMUSCULAR | Status: DC | PRN
  Administered 2021-07-07: 2 mg via INTRAVENOUS

## 2021-07-07 MED ORDER — ROCURONIUM BROMIDE 10 MG/ML (PF) SYRINGE
PREFILLED_SYRINGE | INTRAVENOUS | Status: AC
  Filled 2021-07-07: qty 10

## 2021-07-07 MED ORDER — KETOROLAC TROMETHAMINE 30 MG/ML IJ SOLN
INTRAMUSCULAR | Status: AC
  Filled 2021-07-07: qty 1

## 2021-07-07 MED ORDER — PROPOFOL 10 MG/ML IV BOLUS
INTRAVENOUS | Status: DC | PRN
  Administered 2021-07-07: 200 mg via INTRAVENOUS

## 2021-07-07 MED ORDER — HYDROCODONE-ACETAMINOPHEN 5-325 MG PO TABS: 1.0000 | ORAL_TABLET | ORAL | 0 refills | Status: AC | PRN

## 2021-07-07 MED ORDER — FAMOTIDINE 20 MG PO TABS
ORAL_TABLET | ORAL | Status: AC
  Administered 2021-07-07: 07:00:00 20 mg via ORAL
  Filled 2021-07-07: qty 1

## 2021-07-07 MED ORDER — DEXMEDETOMIDINE (PRECEDEX) IN NS 20 MCG/5ML (4 MCG/ML) IV SYRINGE
PREFILLED_SYRINGE | INTRAVENOUS | Status: DC | PRN
  Administered 2021-07-07 (×3): 4 ug via INTRAVENOUS

## 2021-07-07 MED ORDER — DEXAMETHASONE SODIUM PHOSPHATE 10 MG/ML IJ SOLN
INTRAMUSCULAR | Status: AC
  Filled 2021-07-07: qty 1

## 2021-07-07 MED ORDER — OXYCODONE HCL 5 MG PO TABS
ORAL_TABLET | ORAL | Status: AC
  Administered 2021-07-07: 09:00:00 5 mg via ORAL
  Filled 2021-07-07: qty 1

## 2021-07-07 MED ORDER — FAMOTIDINE 20 MG PO TABS: 20.0000 mg | ORAL_TABLET | Freq: Once | ORAL | Status: AC

## 2021-07-07 MED ORDER — ACETAMINOPHEN 10 MG/ML IV SOLN
INTRAVENOUS | Status: DC | PRN
  Administered 2021-07-07: 1000 mg via INTRAVENOUS

## 2021-07-07 MED ORDER — SEVOFLURANE IN SOLN
RESPIRATORY_TRACT | Status: AC
  Filled 2021-07-07: qty 250

## 2021-07-07 MED ORDER — OXYCODONE HCL 5 MG/5ML PO SOLN: 5.0000 mg | Freq: Once | ORAL | Status: AC | PRN

## 2021-07-07 MED ORDER — FENTANYL CITRATE (PF) 100 MCG/2ML IJ SOLN
INTRAMUSCULAR | Status: AC
  Filled 2021-07-07: qty 2

## 2021-07-07 MED ORDER — PROPOFOL 500 MG/50ML IV EMUL
INTRAVENOUS | Status: DC | PRN
  Administered 2021-07-07: 30 ug/kg/min via INTRAVENOUS

## 2021-07-07 MED ORDER — PROPOFOL 10 MG/ML IV BOLUS
INTRAVENOUS | Status: AC
  Filled 2021-07-07: qty 20

## 2021-07-07 MED ORDER — LIDOCAINE HCL (CARDIAC) PF 100 MG/5ML IV SOSY
PREFILLED_SYRINGE | INTRAVENOUS | Status: DC | PRN
  Administered 2021-07-07: 80 mg via INTRAVENOUS

## 2021-07-07 MED ORDER — ONDANSETRON HCL 4 MG/2ML IJ SOLN
INTRAMUSCULAR | Status: AC
  Filled 2021-07-07: qty 2

## 2021-07-07 MED ORDER — ORAL CARE MOUTH RINSE: 15.0000 mL | Freq: Once | OROMUCOSAL | Status: AC

## 2021-07-07 MED ORDER — DEXAMETHASONE SODIUM PHOSPHATE 10 MG/ML IJ SOLN
INTRAMUSCULAR | Status: DC | PRN
  Administered 2021-07-07: 10 mg via INTRAVENOUS

## 2021-07-07 MED ORDER — OXYCODONE HCL 5 MG PO TABS: 5.0000 mg | ORAL_TABLET | Freq: Once | ORAL | Status: AC | PRN

## 2021-07-07 MED ORDER — KETOROLAC TROMETHAMINE 30 MG/ML IJ SOLN
INTRAMUSCULAR | Status: DC | PRN
  Administered 2021-07-07: 30 mg via INTRAVENOUS

## 2021-07-07 MED ORDER — ONDANSETRON HCL 4 MG PO TABS: 4.0000 mg | ORAL_TABLET | Freq: Every day | ORAL | 1 refills | Status: AC | PRN

## 2021-07-07 MED ORDER — FENTANYL CITRATE (PF) 100 MCG/2ML IJ SOLN
INTRAMUSCULAR | Status: DC | PRN
  Administered 2021-07-07 (×2): 50 ug via INTRAVENOUS

## 2021-07-07 MED ORDER — ONDANSETRON HCL 4 MG/2ML IJ SOLN
4.0000 mg | Freq: Once | INTRAMUSCULAR | Status: AC | PRN
  Administered 2021-07-07: 09:00:00 4 mg via INTRAVENOUS

## 2021-07-07 MED ORDER — ONDANSETRON HCL 4 MG/2ML IJ SOLN
INTRAMUSCULAR | Status: DC | PRN
  Administered 2021-07-07: 4 mg via INTRAVENOUS

## 2021-07-07 MED ORDER — CHLORHEXIDINE GLUCONATE 0.12 % MT SOLN: 15.0000 mL | Freq: Once | OROMUCOSAL | Status: AC

## 2021-07-07 MED ORDER — BUPIVACAINE-EPINEPHRINE 0.25% -1:200000 IJ SOLN
INTRAMUSCULAR | Status: DC | PRN
  Administered 2021-07-07: 30 mL

## 2021-07-07 MED ORDER — BUPIVACAINE-EPINEPHRINE (PF) 0.25% -1:200000 IJ SOLN
INTRAMUSCULAR | Status: AC
  Filled 2021-07-07: qty 30

## 2021-07-07 MED ORDER — PROPOFOL 500 MG/50ML IV EMUL
INTRAVENOUS | Status: AC
  Filled 2021-07-07: qty 50

## 2021-07-07 MED ORDER — INDOCYANINE GREEN 25 MG IV SOLR
1.2500 mg | Freq: Once | INTRAVENOUS | Status: AC
  Administered 2021-07-07: 07:00:00 1.25 mg via INTRAVENOUS
  Filled 2021-07-07: qty 0.5

## 2021-07-07 MED ORDER — ROCURONIUM BROMIDE 100 MG/10ML IV SOLN
INTRAVENOUS | Status: DC | PRN
  Administered 2021-07-07: 50 mg via INTRAVENOUS

## 2021-07-07 MED ORDER — LACTATED RINGERS IV SOLN: INTRAVENOUS | Status: DC

## 2021-07-07 MED ORDER — ACETAMINOPHEN 10 MG/ML IV SOLN: 1000.0000 mg | Freq: Once | INTRAVENOUS | Status: DC | PRN

## 2021-07-07 MED ORDER — LIDOCAINE HCL (PF) 2 % IJ SOLN
INTRAMUSCULAR | Status: AC
  Filled 2021-07-07: qty 5

## 2021-07-07 MED ORDER — CHLORHEXIDINE GLUCONATE 0.12 % MT SOLN
OROMUCOSAL | Status: AC
  Administered 2021-07-07: 07:00:00 15 mL via OROMUCOSAL
  Filled 2021-07-07: qty 15

## 2021-07-07 MED ORDER — CEFAZOLIN SODIUM-DEXTROSE 2-4 GM/100ML-% IV SOLN
2.0000 g | INTRAVENOUS | Status: AC
  Administered 2021-07-07: 08:00:00 2 g via INTRAVENOUS

## 2021-07-07 MED ORDER — MIDAZOLAM HCL 2 MG/2ML IJ SOLN
INTRAMUSCULAR | Status: AC
  Filled 2021-07-07: qty 2

## 2021-07-07 MED ORDER — ACETAMINOPHEN 10 MG/ML IV SOLN
INTRAVENOUS | Status: AC
  Filled 2021-07-07: qty 100

## 2021-07-07 MED ORDER — SUGAMMADEX SODIUM 200 MG/2ML IV SOLN
INTRAVENOUS | Status: DC | PRN
  Administered 2021-07-07: 200 mg via INTRAVENOUS

## 2021-07-07 MED ORDER — FENTANYL CITRATE (PF) 100 MCG/2ML IJ SOLN
INTRAMUSCULAR | Status: AC
  Administered 2021-07-07: 09:00:00 50 ug via INTRAVENOUS
  Filled 2021-07-07: qty 2

## 2021-07-07 MED ORDER — CEFAZOLIN SODIUM-DEXTROSE 2-4 GM/100ML-% IV SOLN
INTRAVENOUS | Status: AC
  Filled 2021-07-07: qty 100

## 2021-07-07 MED ORDER — FENTANYL CITRATE (PF) 100 MCG/2ML IJ SOLN
25.0000 ug | INTRAMUSCULAR | Status: DC | PRN
  Administered 2021-07-07 (×2): 50 ug via INTRAVENOUS

## 2021-07-07 SURGICAL SUPPLY — 52 items
ADH SKN CLS APL DERMABOND .7 (GAUZE/BANDAGES/DRESSINGS) ×1
BAG INFUSER PRESSURE 100CC (MISCELLANEOUS) IMPLANT
BAG SPEC RTRVL LRG 6X4 10 (ENDOMECHANICALS) ×1
BLADE SURG SZ11 CARB STEEL (BLADE) ×4 IMPLANT
CANNULA REDUC XI 12-8 STAPL (CANNULA) ×2
CANNULA REDUC XI 12-8MM STAPL (CANNULA) ×1
CANNULA REDUCER 12-8 DVNC XI (CANNULA) ×2 IMPLANT
CLIP LIGATING HEMO O LOK GREEN (MISCELLANEOUS) ×4 IMPLANT
DECANTER SPIKE VIAL GLASS SM (MISCELLANEOUS) ×8 IMPLANT
DERMABOND ADVANCED (GAUZE/BANDAGES/DRESSINGS) ×2
DERMABOND ADVANCED .7 DNX12 (GAUZE/BANDAGES/DRESSINGS) ×2 IMPLANT
DRAPE ARM DVNC X/XI (DISPOSABLE) ×8 IMPLANT
DRAPE COLUMN DVNC XI (DISPOSABLE) ×2 IMPLANT
DRAPE DA VINCI XI ARM (DISPOSABLE) ×12
DRAPE DA VINCI XI COLUMN (DISPOSABLE) ×3
ELECT REM PT RETURN 9FT ADLT (ELECTROSURGICAL) ×3
ELECTRODE REM PT RTRN 9FT ADLT (ELECTROSURGICAL) ×2 IMPLANT
GAUZE 4X4 16PLY ~~LOC~~+RFID DBL (SPONGE) ×4 IMPLANT
GLOVE SURG ENC MOIS LTX SZ6.5 (GLOVE) ×8 IMPLANT
GLOVE SURG UNDER POLY LF SZ6.5 (GLOVE) ×8 IMPLANT
GOWN STRL REUS W/ TWL LRG LVL3 (GOWN DISPOSABLE) ×6 IMPLANT
GOWN STRL REUS W/TWL LRG LVL3 (GOWN DISPOSABLE) ×9
GRASPER SUT TROCAR 14GX15 (MISCELLANEOUS) ×4 IMPLANT
IRRIGATOR SUCT 8 DISP DVNC XI (IRRIGATION / IRRIGATOR) IMPLANT
IRRIGATOR SUCTION 8MM XI DISP (IRRIGATION / IRRIGATOR)
IV NS 1000ML (IV SOLUTION)
IV NS 1000ML BAXH (IV SOLUTION) IMPLANT
KIT PINK PAD W/HEAD ARE REST (MISCELLANEOUS) ×3
KIT PINK PAD W/HEAD ARM REST (MISCELLANEOUS) ×2 IMPLANT
LABEL OR SOLS (LABEL) ×4 IMPLANT
MANIFOLD NEPTUNE II (INSTRUMENTS) ×4 IMPLANT
NDL INSUFFLATION 14GA 120MM (NEEDLE) ×2 IMPLANT
NEEDLE HYPO 22GX1.5 SAFETY (NEEDLE) ×4 IMPLANT
NEEDLE INSUFFLATION 14GA 120MM (NEEDLE) ×3 IMPLANT
NS IRRIG 500ML POUR BTL (IV SOLUTION) ×4 IMPLANT
OBTURATOR OPTICAL STANDARD 8MM (TROCAR) ×3
OBTURATOR OPTICAL STND 8 DVNC (TROCAR) ×1
OBTURATOR OPTICALSTD 8 DVNC (TROCAR) ×2 IMPLANT
PACK LAP CHOLECYSTECTOMY (MISCELLANEOUS) ×4 IMPLANT
POUCH SPECIMEN RETRIEVAL 10MM (ENDOMECHANICALS) ×4 IMPLANT
SEAL CANN UNIV 5-8 DVNC XI (MISCELLANEOUS) ×6 IMPLANT
SEAL XI 5MM-8MM UNIVERSAL (MISCELLANEOUS) ×9
SET TUBE SMOKE EVAC HIGH FLOW (TUBING) ×4 IMPLANT
SOLUTION ELECTROLUBE (MISCELLANEOUS) ×4 IMPLANT
SPONGE T-LAP 4X18 ~~LOC~~+RFID (SPONGE) IMPLANT
STAPLER CANNULA SEAL DVNC XI (STAPLE) ×2 IMPLANT
STAPLER CANNULA SEAL XI (STAPLE) ×3
SUT MNCRL 4-0 (SUTURE) ×3
SUT MNCRL 4-0 27XMFL (SUTURE) ×1
SUT VICRYL 0 AB UR-6 (SUTURE) ×4 IMPLANT
SUTURE MNCRL 4-0 27XMF (SUTURE) ×2 IMPLANT
WATER STERILE IRR 500ML POUR (IV SOLUTION) ×4 IMPLANT

## 2021-07-07 NOTE — Anesthesia Preprocedure Evaluation (Signed)
Anesthesia Evaluation  Patient identified by MRN, date of birth, ID band Patient awake    Reviewed: Allergy & Precautions, NPO status , Patient's Chart, lab work & pertinent test results  History of Anesthesia Complications Negative for: history of anesthetic complications  Airway Mallampati: II  TM Distance: >3 FB Neck ROM: Full    Dental no notable dental hx. (+) Teeth Intact   Pulmonary neg sleep apnea, neg COPD, Current Smoker and Patient abstained from smoking.,  Vapes   Pulmonary exam normal breath sounds clear to auscultation       Cardiovascular Exercise Tolerance: Good METS(-) hypertension(-) CAD and (-) Past MI negative cardio ROS  (-) dysrhythmias  Rhythm:Regular Rate:Normal - Systolic murmurs    Neuro/Psych  Headaches, negative psych ROS   GI/Hepatic GERD  ,(+)     substance abuse  marijuana use,   Endo/Other  neg diabetes  Renal/GU negative Renal ROS     Musculoskeletal   Abdominal   Peds  Hematology   Anesthesia Other Findings Past Medical History: No date: Anemia No date: GERD (gastroesophageal reflux disease)     Comment:  rare No date: Headache     Comment:  h/o migraines  Reproductive/Obstetrics                             Anesthesia Physical Anesthesia Plan  ASA: 2  Anesthesia Plan: General   Post-op Pain Management: Toradol IV (intra-op) and Ofirmev IV (intra-op)   Induction: Intravenous  PONV Risk Score and Plan: 4 or greater and Ondansetron, Dexamethasone and Midazolam  Airway Management Planned: Oral ETT  Additional Equipment: None  Intra-op Plan:   Post-operative Plan: Extubation in OR  Informed Consent: I have reviewed the patients History and Physical, chart, labs and discussed the procedure including the risks, benefits and alternatives for the proposed anesthesia with the patient or authorized representative who has indicated his/her  understanding and acceptance.     Dental advisory given  Plan Discussed with: CRNA and Surgeon  Anesthesia Plan Comments: (Discussed risks of anesthesia with patient, including PONV, sore throat, lip/dental/eye damage. Rare risks discussed as well, such as cardiorespiratory and neurological sequelae, and allergic reactions. Discussed the role of CRNA in patient's perioperative care. Patient understands. Patient counseled on benefits of smoking cessation, and increased perioperative risks associated with continued smoking. )        Anesthesia Quick Evaluation

## 2021-07-07 NOTE — Op Note (Signed)
Preoperative diagnosis: Cholelithiasis  Postoperative diagnosis: Same  Procedure: Robotic Assisted Laparoscopic Cholecystectomy.   Anesthesia: GETA   Surgeon: Dr. Hazle Quant  Wound Classification: Clean Contaminated  Indications: Patient is a 25 y.o. female developed right upper quadrant pain and on workup was found to have cholelithiasis with a normal common duct. Robotic Assisted Laparoscopic cholecystectomy was elected.  Findings:  Critical view of safety achieved Cystic duct and artery identified, ligated and divided Adequate hemostasis     Description of procedure: The patient was placed on the operating table in the supine position. General anesthesia was induced. A time-out was completed verifying correct patient, procedure, site, positioning, and implant(s) and/or special equipment prior to beginning this procedure. An orogastric tube was placed. The abdomen was prepped and draped in the usual sterile fashion.  An incision was made in a natural skin line below the umbilicus.  The fascia was elevated and the Veress needle inserted. Proper position was confirmed by aspiration and saline meniscus test.  The abdomen was insufflated with carbon dioxide to a pressure of 15 mmHg. The patient tolerated insufflation well. A 8-mm trocar was then inserted in optiview fashion.  The laparoscope was inserted and the abdomen inspected. No injuries from initial trocar placement were noted. Additional trocars were then inserted in the following locations: an 8-mm trocar in the left lateral abdomen, and another two 8-mm trocars to the right side of the abdomen 5 cm appart. The umbilical trocar was changed to a 12 mm trocar all under direct visualization. The abdomen was inspected and no abnormalities were found. The table was placed in the reverse Trendelenburg position with the right side up. The robotic arms were docked and target anatomy identified. Instrument inserted under direct  visualization.  Filmy adhesions between the gallbladder and omentum, duodenum and transverse colon were lysed with electrocautery. The dome of the gallbladder was grasped with a prograsp and retracted over the dome of the liver. The infundibulum was also grasped with an atraumatic grasper and retracted toward the right lower quadrant. This maneuver exposed Calots triangle. The peritoneum overlying the gallbladder infundibulum was then incised and the cystic duct and cystic artery identified and circumferentially dissected. Critical view of safety reviewed before ligating any structure. Firefly images taken to visualize biliary ducts. The cystic duct and cystic artery were then doubly clipped and divided close to the gallbladder.  The gallbladder was then dissected from its peritoneal attachments by electrocautery. Hemostasis was checked and the gallbladder and contained stones were removed using an endoscopic retrieval bag. The gallbladder was passed off the table as a specimen. There was no evidence of bleeding from the gallbladder fossa or cystic artery or leakage of the bile from the cystic duct stump. Secondary trocars were removed under direct vision. No bleeding was noted. The robotic arms were undoked. The scope was withdrawn and the umbilical trocar removed. The abdomen was allowed to collapse. The fascia of the 52mm trocar sites was closed with figure-of-eight 0 vicryl sutures. The skin was closed with subcuticular sutures of 4-0 monocryl and topical skin adhesive. The orogastric tube was removed.  The patient tolerated the procedure well and was taken to the postanesthesia care unit in stable condition.   Specimen: Gallbladder  Complications: None  EBL: 5 mL

## 2021-07-07 NOTE — Anesthesia Postprocedure Evaluation (Signed)
Anesthesia Post Note  Patient: Pammy Vesey  Procedure(s) Performed: XI ROBOTIC ASSISTED LAPAROSCOPIC CHOLECYSTECTOMY (Abdomen)  Patient location during evaluation: PACU Anesthesia Type: General Level of consciousness: awake and alert Pain management: pain level controlled Vital Signs Assessment: post-procedure vital signs reviewed and stable Respiratory status: spontaneous breathing, nonlabored ventilation, respiratory function stable and patient connected to nasal cannula oxygen Cardiovascular status: blood pressure returned to baseline and stable Postop Assessment: no apparent nausea or vomiting Anesthetic complications: no   No notable events documented.   Last Vitals:  Vitals:   07/07/21 0929 07/07/21 0946  BP: 111/74 110/61  Pulse: 71 75  Resp: 15 17  Temp: 36.5 C 36.6 C  SpO2: 95% 97%    Last Pain:  Vitals:   07/07/21 0946  TempSrc: Temporal  PainSc: 3                  Corinda Gubler

## 2021-07-07 NOTE — Interval H&P Note (Signed)
History and Physical Interval Note:  07/07/2021 7:03 AM  Janet Greer  has presented today for surgery, with the diagnosis of K80.20 Cholelithiasis w/o cholecystitis.  The various methods of treatment have been discussed with the patient and family. After consideration of risks, benefits and other options for treatment, the patient has consented to  Procedure(s): XI ROBOTIC ASSISTED LAPAROSCOPIC CHOLECYSTECTOMY (N/A) as a surgical intervention.  The patient's history has been reviewed, patient examined, no change in status, stable for surgery.  I have reviewed the patient's chart and labs.  Questions were answered to the patient's satisfaction.     Carolan Shiver

## 2021-07-07 NOTE — Transfer of Care (Signed)
Immediate Anesthesia Transfer of Care Note  Patient: Janet Greer  Procedure(s) Performed: XI ROBOTIC ASSISTED LAPAROSCOPIC CHOLECYSTECTOMY (Abdomen)  Patient Location: PACU  Anesthesia Type:General  Level of Consciousness: drowsy  Airway & Oxygen Therapy: Patient Spontanous Breathing and Patient connected to nasal cannula oxygen  Post-op Assessment: Report given to RN and Post -op Vital signs reviewed and stable  Post vital signs: Reviewed and stable  Last Vitals:  Vitals Value Taken Time  BP 114/58 07/07/21 0845  Temp    Pulse 88 07/07/21 0845  Resp 23 07/07/21 0845  SpO2 99 % 07/07/21 0845  Vitals shown include unvalidated device data.  Last Pain:  Vitals:   07/07/21 0622  TempSrc: Oral  PainSc: 0-No pain         Complications: No notable events documented.

## 2021-07-07 NOTE — Discharge Instructions (Addendum)
  Diet: Resume home heart healthy regular diet.   Activity: No heavy lifting >20 pounds (children, pets, laundry, garbage) or strenuous activity until follow-up, but light activity and walking are encouraged. Do not drive or drink alcohol if taking narcotic pain medications.  Wound care: May shower with soapy water and pat dry (do not rub incisions), but no baths or submerging incision underwater until follow-up. (no swimming)   Medications: Resume all home medications. For mild to moderate pain: acetaminophen (Tylenol) ***or ibuprofen (if no kidney disease). Combining Tylenol with alcohol can substantially increase your risk of causing liver disease. Narcotic pain medications, if prescribed, can be used for severe pain, though may cause nausea, constipation, and drowsiness. Do not combine Tylenol and Norco within a 6 hour period as Norco contains Tylenol. If you do not need the narcotic pain medication, you do not need to fill the prescription.  Call office ([phone]) at any time if any questions, worsening pain, fevers/chills, bleeding, drainage from incision site, or other concerns.   AMBULATORY SURGERY  DISCHARGE INSTRUCTIONS   The drugs that you were given will stay in your system until tomorrow so for the next 24 hours you should not:  Drive an automobile Make any legal decisions Drink any alcoholic beverage   You may resume regular meals tomorrow.  Today it is better to start with liquids and gradually work up to solid foods.  You may eat anything you prefer, but it is better to start with liquids, then soup and crackers, and gradually work up to solid foods.   Please notify your doctor immediately if you have any unusual bleeding, trouble breathing, redness and pain at the surgery site, drainage, fever, or pain not relieved by medication.    Additional Instructions:        Please contact your physician with any problems or Same Day Surgery at [phone], Monday  through Friday 6 am to 4 pm, or [place] [facility] [place] Main number at [phone].  

## 2021-07-07 NOTE — Anesthesia Procedure Notes (Signed)
Procedure Name: Intubation Date/Time: 07/07/2021 7:40 AM Performed by: Hezzie Bump, CRNA Pre-anesthesia Checklist: Patient identified, Patient being monitored, Timeout performed, Emergency Drugs available and Suction available Patient Re-evaluated:Patient Re-evaluated prior to induction Oxygen Delivery Method: Circle system utilized Preoxygenation: Pre-oxygenation with 100% oxygen Induction Type: IV induction Ventilation: Mask ventilation without difficulty Laryngoscope Size: 3 and McGraph Grade View: Grade I Tube type: Oral Tube size: 6.5 mm Number of attempts: 1 Airway Equipment and Method: Video-laryngoscopy Placement Confirmation: ETT inserted through vocal cords under direct vision, positive ETCO2 and breath sounds checked- equal and bilateral Secured at: 20 cm Tube secured with: Tape Dental Injury: Teeth and Oropharynx as per pre-operative assessment

## 2021-07-08 LAB — SURGICAL PATHOLOGY

## 2021-10-31 IMAGING — CT CT ABD-PELV W/ CM
2 of 4 series · 16 of 46 positions shown, 18 images · IV contrast (APPLIED)
Comparison: CT abdomen and pelvis dated August 13, 2019

CLINICAL DATA: Abdominal pain

EXAM:
CT ABDOMEN AND PELVIS WITH CONTRAST
TECHNIQUE: Multidetector CT imaging of the abdomen and pelvis was performed
using the standard protocol following bolus administration of
intravenous contrast.
CONTRAST:  100mL OMNIPAQUE IOHEXOL 350 MG/ML SOLN

[Series 2: routine abd/pel with · axial · 0.98mm/px · z∈[-415,+55]mm · 13 of 104 slices shown, 15 images]
[im 5/104  soft-tissue]
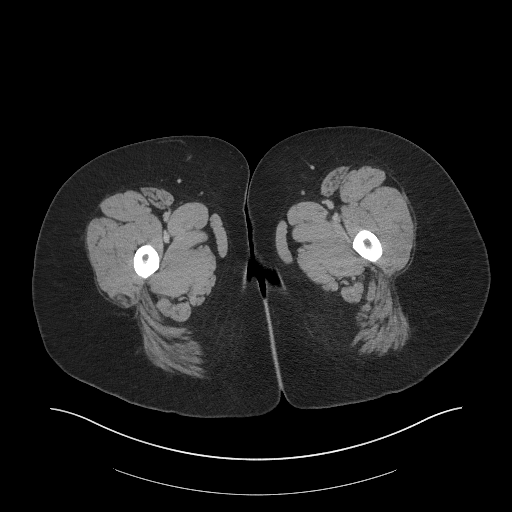
[im 5/104  bone]
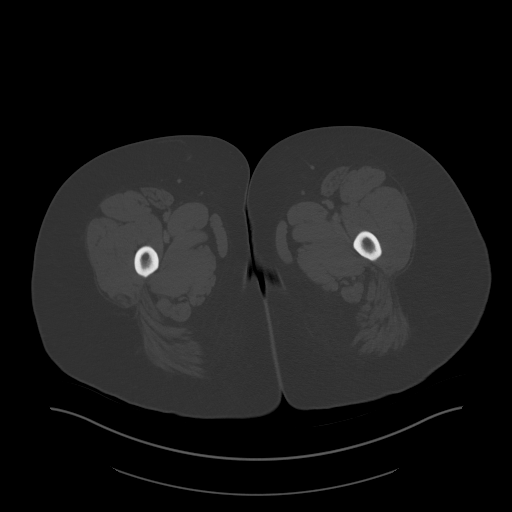
[im 13/104  soft-tissue]
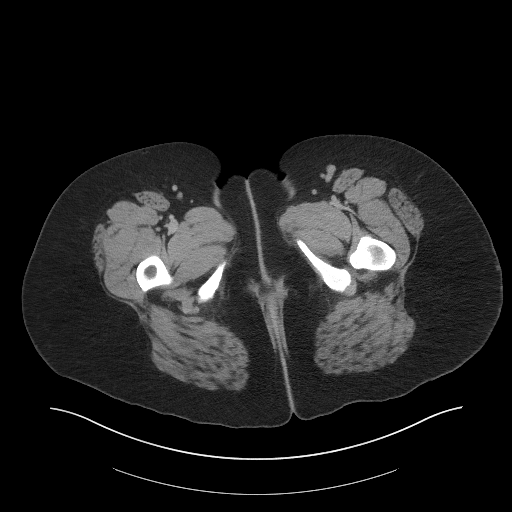
[im 22/104  soft-tissue]
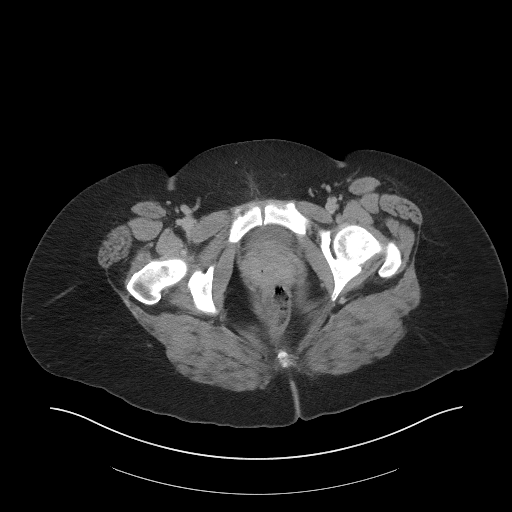
[im 31/104  soft-tissue]
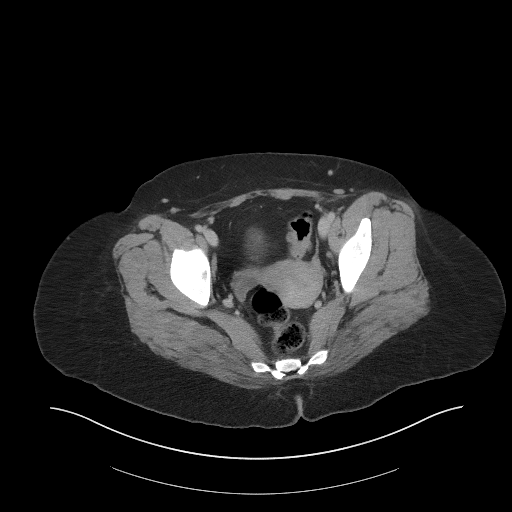
[im 35/104  soft-tissue]
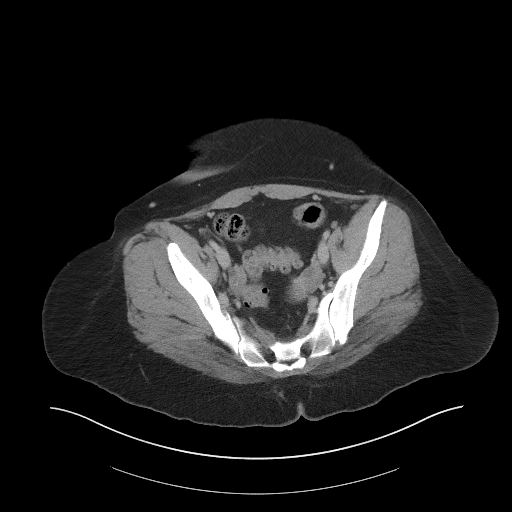
[im 43/104  soft-tissue]
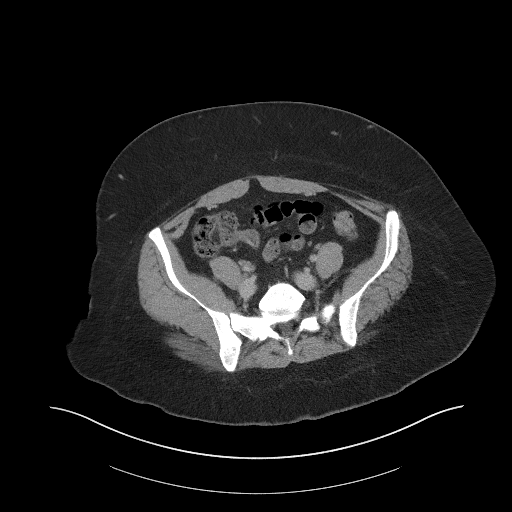
[im 52/104  soft-tissue]
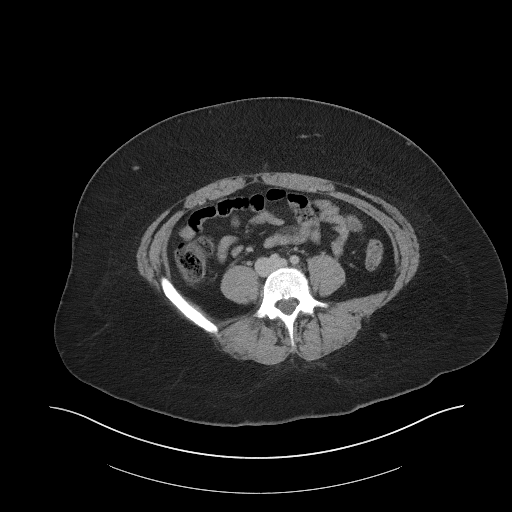
[im 61/104  soft-tissue]
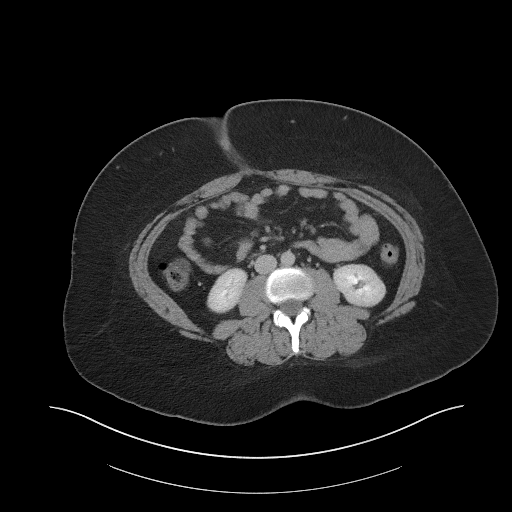
[im 69/104  soft-tissue]
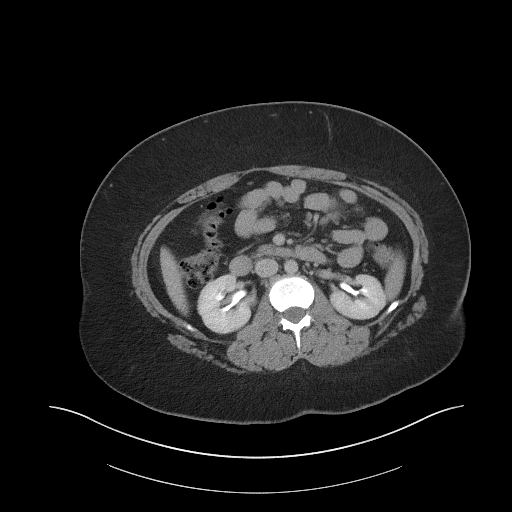
[im 69/104  bone]
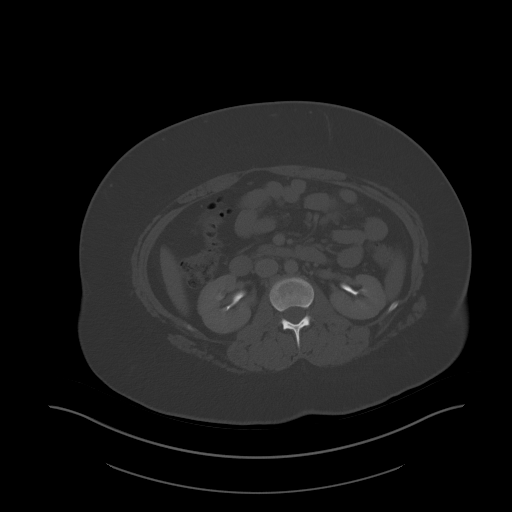
[im 73/104  soft-tissue]
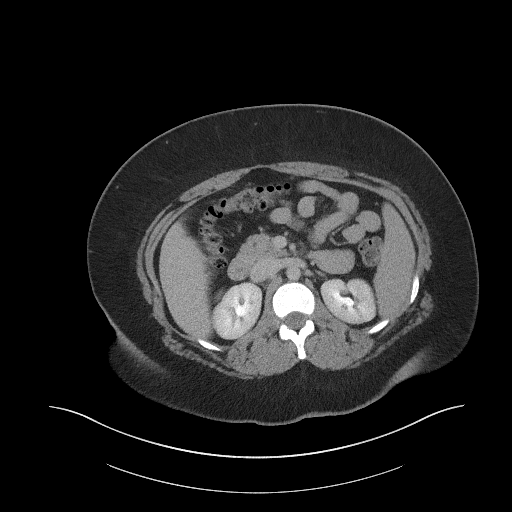
[im 82/104  soft-tissue]
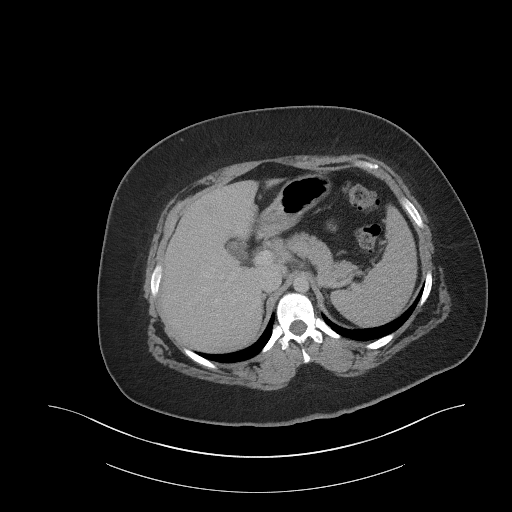
[im 91/104  soft-tissue]
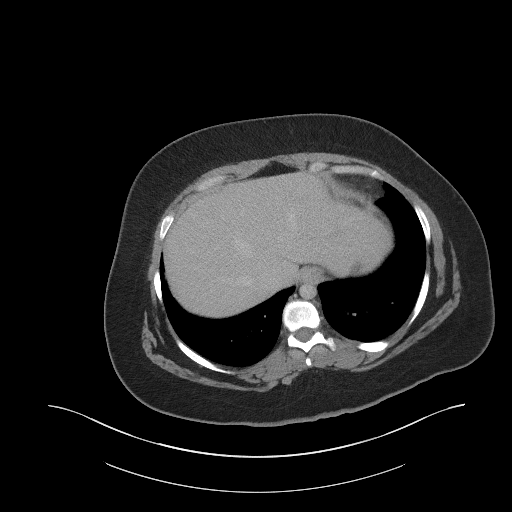
[im 99/104  soft-tissue]
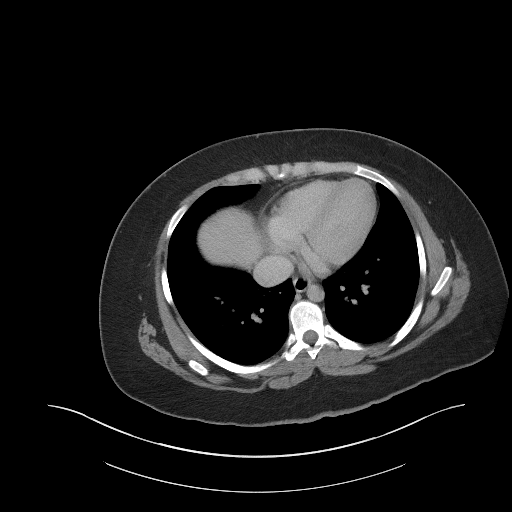

[Series 5: coronal st · coronal · 0.80mm/px · 3 of 109 slices shown]
[im 37/109  soft-tissue]
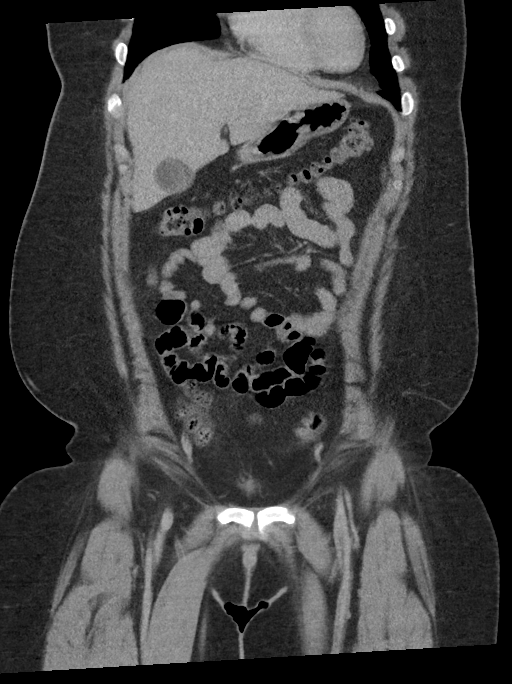
[im 49/109  soft-tissue]
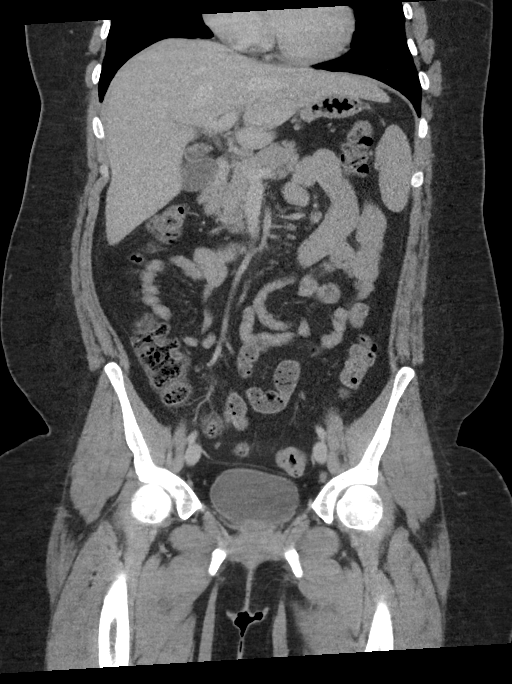
[im 61/109  soft-tissue]
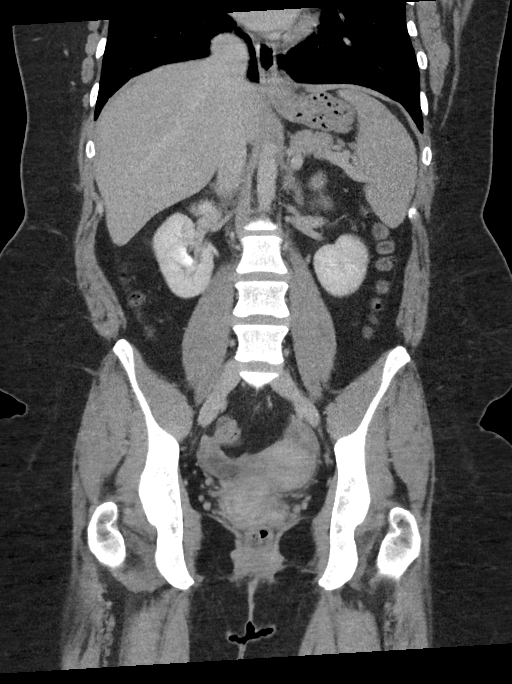

[16 of 46 positions shown; findings below may reference images not displayed]

FINDINGS: Lower chest: No acute abnormality.

Hepatobiliary: No focal liver abnormality is seen. No gallstones,
gallbladder wall thickening, or biliary dilatation.

Pancreas: Unremarkable. No pancreatic ductal dilatation or
surrounding inflammatory changes.

Spleen: Normal in size without focal abnormality.

Adrenals/Urinary Tract: Adrenal glands are unremarkable. Kidneys are
normal, without renal calculi, focal lesion, or hydronephrosis.
Bladder is unremarkable.

Stomach/Bowel: Stomach is within normal limits. Appendix is not
visualized, although there are no secondary findings acute
appendicitis. Scattered colonic diverticula. No evidence of bowel
wall thickening, distention, or inflammatory changes.

Vascular/Lymphatic: No significant vascular findings are present. No
enlarged abdominal or pelvic lymph nodes.

Reproductive: Uterus and bilateral adnexa are unremarkable.

Other: No abdominal wall hernia or abnormality. No abdominopelvic
ascites.

Musculoskeletal: No acute or significant osseous findings.
IMPRESSION: No acute CT findings in the abdomen or pelvis to explain abdominal
pain.

## 2022-02-13 ENCOUNTER — Encounter: Payer: Self-pay | Admitting: Emergency Medicine

## 2022-02-13 ENCOUNTER — Other Ambulatory Visit: Payer: Self-pay

## 2022-02-13 ENCOUNTER — Emergency Department
Admission: EM | Admit: 2022-02-13 | Discharge: 2022-02-13 | Discharge status: Against Medical Advice | Payer: Medicaid Other | Attending: Emergency Medicine | Admitting: Emergency Medicine

## 2022-02-13 DIAGNOSIS — Z5321 Procedure and treatment not carried out due to patient leaving prior to being seen by health care provider: Secondary | ICD-10-CM | POA: Diagnosis not present

## 2022-02-13 DIAGNOSIS — S00521A Blister (nonthermal) of lip, initial encounter: Secondary | ICD-10-CM | POA: Diagnosis not present

## 2022-02-13 DIAGNOSIS — X58XXXA Exposure to other specified factors, initial encounter: Secondary | ICD-10-CM | POA: Insufficient documentation

## 2022-02-13 DIAGNOSIS — R112 Nausea with vomiting, unspecified: Secondary | ICD-10-CM | POA: Insufficient documentation

## 2022-02-13 DIAGNOSIS — L559 Sunburn, unspecified: Secondary | ICD-10-CM | POA: Insufficient documentation

## 2022-02-13 MED ORDER — ONDANSETRON 4 MG PO TBDP
4.0000 mg | ORAL_TABLET | Freq: Once | ORAL | Status: AC
  Administered 2022-02-13: 4 mg via ORAL
  Filled 2022-02-13: qty 1

## 2022-02-13 NOTE — ED Notes (Addendum)
ED staff rounded on pt multiple times. Pt was not to be found in room. Pt assumed to eloped.
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
## 2022-04-07 ENCOUNTER — Encounter (HOSPITAL_COMMUNITY): Payer: Self-pay

## 2022-04-07 ENCOUNTER — Ambulatory Visit (HOSPITAL_COMMUNITY)
Admission: EM | Admit: 2022-04-07 | Discharge: 2022-04-07 | Disposition: A | Discharge status: Home / Self Care | Payer: Medicaid Other | Attending: Family Medicine | Admitting: Family Medicine

## 2022-04-07 DIAGNOSIS — J020 Streptococcal pharyngitis: Secondary | ICD-10-CM

## 2022-04-07 LAB — POCT RAPID STREP A, ED / UC: Streptococcus, Group A Screen (Direct): POSITIVE — AB

## 2022-04-07 MED ORDER — AMOXICILLIN 500 MG PO CAPS: 500.0000 mg | ORAL_CAPSULE | Freq: Three times a day (TID) | ORAL | 0 refills | Status: DC

## 2022-04-07 NOTE — ED Triage Notes (Signed)
Pt is here for sore throat onset 04/04/2022 . Pt states throat is sore  with painful to swallow, nausea , headaches and chills

## 2022-04-07 NOTE — Discharge Instructions (Addendum)
You were seen today for strep throat.  Your strep test was POSITIVE.  I have sent out amoxicillin three times/day x 10 days.  Please get a new toothbrush in 2 days.  Also clean/launder your pillow cases and bedding as well.  You may use tylenol/motrin for pain if needed.

## 2022-04-07 NOTE — ED Provider Notes (Signed)
[facility]    CSN: [id number] Arrival date & time: 04/07/22  1354      History   Chief Complaint Chief Complaint  Patient presents with   Sore Throat    HPI Janet Greer is a 26 y.o. female.   Patient is here for a sore throat x 4 days.  Waking up in sweats, no chills.   Also some drainage down her throat.  Mild cough. No known fevers.  She has been taking advil for the sore throat.  No known sick contacts.  + headaches.  No n/v.        Past Medical History:  Diagnosis Date   Anemia    GERD (gastroesophageal reflux disease)    rare   Headache    h/o migraines    Patient Active Problem List   Diagnosis Date Noted   Full-term premature rupture of membranes with onset of labor within 24 hours of rupture 12/06/2016   Indication for care in labor and delivery, antepartum 12/05/2016    Past Surgical History:  Procedure Laterality Date   WISDOM TOOTH EXTRACTION     2014    OB History     Gravida  1   Para      Term      Preterm      AB      Living         SAB      IAB      Ectopic      Multiple      Live Births               Home Medications    Prior to Admission medications   Medication Sig Start Date End Date Taking? Authorizing Provider  norgestimate-ethinyl estradiol (ORTHO-CYCLEN) 0.25-35 MG-MCG tablet Take 1 tablet by mouth daily. 01/03/21  Yes [provider]  bismuth subsalicylate (PEPTO-BISMOL) 262 MG/15ML suspension Take 30 mLs by mouth every 6 (six) hours as needed for indigestion.    [provider]  calcium carbonate (TUMS - DOSED IN MG ELEMENTAL CALCIUM) 500 MG chewable tablet Chew 1 tablet by mouth as needed for indigestion or heartburn.    [provider]  etonogestrel (NEXPLANON) 68 MG IMPL implant 1 each by Subdermal route once.    [provider]  ondansetron (ZOFRAN) 4 MG tablet Take 1 tablet (4 mg total) by mouth daily as needed for nausea or vomiting.  07/07/21 07/07/22  Carolan Shiver, MD  ondansetron (ZOFRAN-ODT) 8 MG disintegrating tablet Take 8 mg by mouth every 8 (eight) hours as needed for nausea or vomiting.    [provider]  oxyCODONE-acetaminophen (PERCOCET) 5-325 MG tablet Take 1 tablet by mouth every 4 (four) hours as needed for severe pain. 06/29/21 06/29/22  Chesley Noon, MD  traMADol (ULTRAM) 50 MG tablet Take 1 tablet (50 mg total) by mouth every 6 (six) hours as needed. 06/22/21   Chinita Pester, FNP    Family History History reviewed. No pertinent family history.  Social History Social History   Tobacco Use   Smoking status: Every Day    Types: E-cigarettes   Smokeless tobacco: Never  Vaping Use   Vaping Use: Every day   Substances: Nicotine  Substance Use Topics   Alcohol use: No   Drug use: No     Allergies   Morphine and Morphine and related   Review of Systems Review of Systems  Constitutional:  Positive for chills. Negative for fever.  HENT:  Positive for postnasal drip, sore throat and trouble swallowing. Negative for congestion.   Respiratory: Negative.    Gastrointestinal: Negative.   Genitourinary: Negative.   Musculoskeletal: Negative.   Neurological:  Positive for headaches.  Psychiatric/Behavioral: Negative.       Physical Exam Triage Vital Signs ED Triage Vitals  Enc Vitals Group     BP 04/07/22 1413 (!) 111/91     Pulse Rate 04/07/22 1413 (!) 103     Resp 04/07/22 1413 16     Temp 04/07/22 1413 98.4 F (36.9 C)     Temp Source 04/07/22 1413 Oral     SpO2 04/07/22 1413 99 %     Weight 04/07/22 1414 210 lb (95.3 kg)     Height 04/07/22 1414 5' (1.524 m)     Head Circumference --      Peak Flow --      Pain Score 04/07/22 1414 9     Pain Loc --      Pain Edu? --      Excl. in GC? --    No data found.  Updated Vital Signs BP (!) 111/91 (BP Location: Left Arm)   Pulse (!) 103   Temp 98.4 F (36.9 C) (Oral)   Resp 16   Ht 5' (1.524 m)   Wt 95.3 kg    LMP  (LMP Unknown)   SpO2 99%   BMI 41.01 kg/m   Visual Acuity Right Eye Distance:   Left Eye Distance:   Bilateral Distance:    Right Eye Near:   Left Eye Near:    Bilateral Near:     Physical Exam Constitutional:      Appearance: She is well-developed.  HENT:     Head: Normocephalic.     Nose: No congestion or rhinorrhea.     Mouth/Throat:     Mouth: No oral lesions.     Pharynx: Posterior oropharyngeal erythema present. No oropharyngeal exudate.     Tonsils: No tonsillar exudate. 3+ on the right. 3+ on the left.  Cardiovascular:     Rate and Rhythm: Normal rate and regular rhythm.     Heart sounds: Normal heart sounds.  Pulmonary:     Effort: Pulmonary effort is normal.  Musculoskeletal:     Cervical back: Normal range of motion and neck supple.  Lymphadenopathy:     Cervical: Cervical adenopathy present.  Skin:    General: Skin is warm.  Neurological:     General: No focal deficit present.     Mental Status: She is alert.  Psychiatric:        Mood and Affect: Mood normal.      UC Treatments / Results  Labs (all labs ordered are listed, but only abnormal results are displayed) Labs Reviewed  POCT RAPID STREP A, ED / UC - Abnormal; Notable for the following components:      Result Value   Streptococcus, Group A Screen (Direct) POSITIVE (*)    All other components within normal limits    EKG   Radiology No results found.  Procedures Procedures (including critical care time)  Medications Ordered in UC Medications - No data to display  Initial Impression / Assessment and Plan / UC Course  I have reviewed the triage vital signs and the nursing notes.  Pertinent labs & imaging results that were available during my care of the patient were reviewed by me and considered in my medical decision making (see chart for details).  Final Clinical Impressions(s) / UC Diagnoses   Final diagnoses:  Strep pharyngitis     Discharge Instructions       You were seen today for strep throat.  Your strep test was POSITIVE.  I have sent out amoxicillin three times/day x 10 days.  Please get a new toothbrush in 2 days.  Also clean/launder your pillow cases and bedding as well.  You may use tylenol/motrin for pain if needed.     ED Prescriptions     Medication Sig Dispense Auth. Provider   amoxicillin (AMOXIL) 500 MG capsule Take 1 capsule (500 mg total) by mouth 3 (three) times daily. 30 capsule Rondel Oh, MD      PDMP not reviewed this encounter.   Rondel Oh, MD 04/07/22 1435

## 2022-07-30 ENCOUNTER — Ambulatory Visit (HOSPITAL_COMMUNITY)
Admission: EM | Admit: 2022-07-30 | Discharge: 2022-07-30 | Disposition: A | Discharge status: Home / Self Care | Payer: Medicaid Other | Attending: Nurse Practitioner | Admitting: Nurse Practitioner

## 2022-07-30 ENCOUNTER — Encounter (HOSPITAL_COMMUNITY): Payer: Self-pay | Admitting: Emergency Medicine

## 2022-07-30 ENCOUNTER — Other Ambulatory Visit: Payer: Self-pay

## 2022-07-30 DIAGNOSIS — K529 Noninfective gastroenteritis and colitis, unspecified: Secondary | ICD-10-CM

## 2022-07-30 MED ORDER — ONDANSETRON HCL 4 MG PO TABS: 4.0000 mg | ORAL_TABLET | Freq: Three times a day (TID) | ORAL | 0 refills | Status: DC | PRN

## 2022-07-30 NOTE — ED Provider Notes (Signed)
[facility]    CSN: [id number] Arrival date & time: 07/30/22  1404      History   Chief Complaint Chief Complaint  Patient presents with   Abdominal Pain    HPI Janet Greer is a 27 y.o. female Patient is a 27 y.o. female who presents for evaluation of nausea, vomiting, diarrhea and abdominal pain for 3 days. Patient reports 2 episodes of vomiting. Patient reports several episodes of diarrhea. Vomit and stool are non bloody.  She is able to keep fluids down.  Denies dysuria.  No fever, chills, body aches, rash or syncope.  Does report some lower abdominal cramping that comes and goes and improves with ibuprofen.  Denies any URI symptoms.  No recent travel.  No ingestion of contaminated or undercooked food.  No other household members have similar symptoms.  Patient has history of cholecystectomy but other abdominal surgeries.  Patient uses Implanon as birth control and states she just completed her menses.  Denies any pregnancy risk.  No other concerns at this time.      Abdominal Pain Associated symptoms: diarrhea, nausea and vomiting     Past Medical History:  Diagnosis Date   Anemia    GERD (gastroesophageal reflux disease)    rare   Headache    h/o migraines    Patient Active Problem List   Diagnosis Date Noted   Full-term premature rupture of membranes with onset of labor within 24 hours of rupture 12/06/2016   Indication for care in labor and delivery, antepartum 12/05/2016    Past Surgical History:  Procedure Laterality Date   CHOLECYSTECTOMY     WISDOM TOOTH EXTRACTION     2014    OB History     Gravida  1   Para      Term      Preterm      AB      Living         SAB      IAB      Ectopic      Multiple      Live Births               Home Medications    Prior to Admission medications   Medication Sig Start Date End Date Taking? Authorizing Provider  ondansetron (ZOFRAN) 4 MG tablet Take 1 tablet (4 mg total)  by mouth every 8 (eight) hours as needed for nausea or vomiting. 07/30/22  Yes Melynda Ripple, NP  bismuth subsalicylate (PEPTO-BISMOL) 262 MG/15ML suspension Take 30 mLs by mouth every 6 (six) hours as needed for indigestion.    [provider]  calcium carbonate (TUMS - DOSED IN MG ELEMENTAL CALCIUM) 500 MG chewable tablet Chew 1 tablet by mouth as needed for indigestion or heartburn.    [provider]  etonogestrel (NEXPLANON) 68 MG IMPL implant 1 each by Subdermal route once.    [provider]  norgestimate-ethinyl estradiol (ORTHO-CYCLEN) 0.25-35 MG-MCG tablet Take 1 tablet by mouth daily. 01/03/21   [provider]  ondansetron (ZOFRAN-ODT) 8 MG disintegrating tablet Take 8 mg by mouth every 8 (eight) hours as needed for nausea or vomiting.    [provider]  traMADol (ULTRAM) 50 MG tablet Take 1 tablet (50 mg total) by mouth every 6 (six) hours as needed. 06/22/21   Victorino Dike, FNP    Family History History reviewed. No pertinent family history.  Social History Social History   Tobacco Use  Smoking status: Every Day    Types: E-cigarettes   Smokeless tobacco: Never  Vaping Use   Vaping Use: Every day   Substances: Nicotine  Substance Use Topics   Alcohol use: No   Drug use: No     Allergies   Morphine and Morphine and related   Review of Systems Review of Systems  Gastrointestinal:  Positive for abdominal pain, diarrhea, nausea and vomiting.     Physical Exam Triage Vital Signs ED Triage Vitals  Enc Vitals Group     BP 07/30/22 1527 119/79     Pulse Rate 07/30/22 1527 98     Resp 07/30/22 1527 20     Temp 07/30/22 1527 98.1 F (36.7 C)     Temp Source 07/30/22 1527 Oral     SpO2 07/30/22 1527 97 %     Weight --      Height --      Head Circumference --      Peak Flow --      Pain Score 07/30/22 1525 8     Pain Loc --      Pain Edu? --      Excl. in GC? --    No data found.  Updated Vital Signs BP  119/79 (BP Location: Left Arm)   Pulse 98   Temp 98.1 F (36.7 C) (Oral)   Resp 20   SpO2 97%   Visual Acuity Right Eye Distance:   Left Eye Distance:   Bilateral Distance:    Right Eye Near:   Left Eye Near:    Bilateral Near:     Physical Exam Vitals and nursing note reviewed.  Constitutional:      General: She is not in acute distress.    Appearance: She is well-developed. She is not ill-appearing, toxic-appearing or diaphoretic.  HENT:     Head: Normocephalic and atraumatic.  Eyes:     Pupils: Pupils are equal, round, and reactive to light.  Cardiovascular:     Rate and Rhythm: Normal rate.  Pulmonary:     Effort: Pulmonary effort is normal.  Abdominal:     General: Bowel sounds are normal. There is no distension.     Palpations: Abdomen is soft.     Tenderness: There is generalized abdominal tenderness. There is no right CVA tenderness, left CVA tenderness or guarding. Negative signs include Rovsing's sign and McBurney's sign.  Skin:    General: Skin is warm and dry.  Neurological:     General: No focal deficit present.     Mental Status: She is alert and oriented to person, place, and time.  Psychiatric:        Mood and Affect: Mood normal.        Behavior: Behavior normal.      UC Treatments / Results  Labs (all labs ordered are listed, but only abnormal results are displayed) Labs Reviewed - No data to display  EKG   Radiology No results found.  Procedures Procedures (including critical care time)  Medications Ordered in UC Medications - No data to display  Initial Impression / Assessment and Plan / UC Course  I have reviewed the triage vital signs and the nursing notes.  Pertinent labs & imaging results that were available during my care of the patient were reviewed by me and considered in my medical decision making (see chart for details).     Reviewed exam and symptoms with patient.  No red flags on exam.  Vital signs  are  stable. Discussed viral enteritis and symptomatic treatment Zofran as needed Brat diet and encouraged fluids OTC antidiarrheals as needed.   Rest and fluids Follow-up with PCP 2 to 3 days for recheck ER precautions reviewed and patient verbalized understanding Final Clinical Impressions(s) / UC Diagnoses   Final diagnoses:  Gastroenteritis     Discharge Instructions      Zofran as needed for nausea/vomiting Lots of fluids including Pedialyte, Gatorade, Powerade to replace electrolytes loss with diarrhea Brat diet and advance as tolerated.  Foods include bread, rice, applesauce, toast, bananas Please follow-up with your PCP in 2 days for recheck Please go to the emergency room if you have any worsening symptoms   ED Prescriptions     Medication Sig Dispense Auth. Provider   ondansetron (ZOFRAN) 4 MG tablet Take 1 tablet (4 mg total) by mouth every 8 (eight) hours as needed for nausea or vomiting. 12 tablet Melynda Ripple, NP      PDMP not reviewed this encounter.   Melynda Ripple, NP 07/30/22 [phone]

## 2022-07-30 NOTE — Discharge Instructions (Signed)
Zofran as needed for nausea/vomiting Lots of fluids including Pedialyte, Gatorade, Powerade to replace electrolytes loss with diarrhea Brat diet and advance as tolerated.  Foods include bread, rice, applesauce, toast, bananas Please follow-up with your PCP in 2 days for recheck Please go to the emergency room if you have any worsening symptoms

## 2022-07-30 NOTE — ED Triage Notes (Signed)
3 days ago started having stomach pain, diarrhea , nausea.  Last night started having headaches.  ">5" episodes of diarrhea today.  One episode of vomiting today.

## 2022-08-09 ENCOUNTER — Emergency Department
Admission: EM | Admit: 2022-08-09 | Discharge: 2022-08-09 | Disposition: A | Discharge status: Home / Self Care | Payer: Medicaid Other | Attending: Emergency Medicine | Admitting: Emergency Medicine

## 2022-08-09 ENCOUNTER — Other Ambulatory Visit: Payer: Self-pay

## 2022-08-09 DIAGNOSIS — U071 COVID-19: Secondary | ICD-10-CM | POA: Diagnosis not present

## 2022-08-09 DIAGNOSIS — R0981 Nasal congestion: Secondary | ICD-10-CM | POA: Diagnosis present

## 2022-08-09 LAB — COMPREHENSIVE METABOLIC PANEL
ALT: 40 U/L (ref 0–44)
AST: 26 U/L (ref 15–41)
Albumin: 3.6 g/dL (ref 3.5–5.0)
Alkaline Phosphatase: 63 U/L (ref 38–126)
Anion gap: 7 (ref 5–15)
BUN: 10 mg/dL (ref 6–20)
CO2: 22 mmol/L (ref 22–32)
Calcium: 8.4 mg/dL — ABNORMAL LOW (ref 8.9–10.3)
Chloride: 108 mmol/L (ref 98–111)
Creatinine, Ser: 0.74 mg/dL (ref 0.44–1.00)
GFR, Estimated: >60 mL/min (ref >60)
Glucose, Bld: 98 mg/dL (ref 70–99)
Potassium: 4.3 mmol/L (ref 3.5–5.1)
Sodium: 137 mmol/L (ref 135–145)
Total Bilirubin: 0.4 mg/dL (ref 0.3–1.2)
Total Protein: 8 g/dL (ref 6.5–8.1)

## 2022-08-09 LAB — RESP PANEL BY RT-PCR (RSV, FLU A&B, COVID)  RVPGX2
Influenza A by PCR: NEGATIVE
Influenza B by PCR: NEGATIVE
Resp Syncytial Virus by PCR: NEGATIVE
SARS Coronavirus 2 by RT PCR: POSITIVE — AB

## 2022-08-09 LAB — URINALYSIS, ROUTINE W REFLEX MICROSCOPIC
Bilirubin Urine: NEGATIVE
Glucose, UA: NEGATIVE mg/dL
Hgb urine dipstick: NEGATIVE
Ketones, ur: NEGATIVE mg/dL
Nitrite: NEGATIVE
Protein, ur: NEGATIVE mg/dL
Specific Gravity, Urine: 1.014 (ref 1.005–1.030)
pH: 6 (ref 5.0–8.0)

## 2022-08-09 LAB — CBC
HCT: 41.3 % (ref 36.0–46.0)
Hemoglobin: 13.1 g/dL (ref 12.0–15.0)
MCH: 25.3 pg — ABNORMAL LOW (ref 26.0–34.0)
MCHC: 31.7 g/dL (ref 30.0–36.0)
MCV: 79.9 fL — ABNORMAL LOW (ref 80.0–100.0)
Platelets: 282 10*3/uL (ref 150–400)
RBC: 5.17 MIL/uL — ABNORMAL HIGH (ref 3.87–5.11)
RDW: 14 % (ref 11.5–15.5)
WBC: 4.6 10*3/uL (ref 4.0–10.5)
nRBC: 0 % (ref 0.0–0.2)

## 2022-08-09 LAB — POC URINE PREG, ED: Preg Test, Ur: NEGATIVE

## 2022-08-09 LAB — LIPASE, BLOOD: Lipase: 35 U/L (ref 11–51)

## 2022-08-09 MED ORDER — OXYCODONE-ACETAMINOPHEN 5-325 MG PO TABS
1.0000 | ORAL_TABLET | Freq: Once | ORAL | Status: AC
  Administered 2022-08-09: 1 via ORAL
  Filled 2022-08-09: qty 1

## 2022-08-09 MED ORDER — ONDANSETRON 4 MG PO TBDP: 4.0000 mg | ORAL_TABLET | Freq: Three times a day (TID) | ORAL | 0 refills | Status: DC | PRN

## 2022-08-09 MED ORDER — ONDANSETRON 4 MG PO TBDP
4.0000 mg | ORAL_TABLET | Freq: Once | ORAL | Status: AC
  Administered 2022-08-09: 4 mg via ORAL
  Filled 2022-08-09: qty 1

## 2022-08-09 NOTE — ED Provider Notes (Signed)
[facility] Provider Note    Event Date/Time   First MD Initiated Contact with Patient 08/09/22 [phone]     (approximate)   History   Emesis, Back Pain, and Headache   HPI  Janet Greer is a 27 y.o. female with history of headaches, GERD and anemia presents emergency department complaining of headache, fatigue vomiting and back pain for 2 days.  Headache actually started 4 days ago.  Unsure if she has had fever.  No diarrhea.  Also has a lot of nasal congestion and cough.      Physical Exam   Triage Vital Signs: ED Triage Vitals  Enc Vitals Group     BP 08/09/22 0921 118/74     Pulse Rate 08/09/22 0921 89     Resp 08/09/22 0921 18     Temp 08/09/22 0921 97.9 F (36.6 C)     Temp Source 08/09/22 0921 Oral     SpO2 08/09/22 0921 97 %     Weight 08/09/22 0921 215 lb (97.5 kg)     Height 08/09/22 0921 5' (1.524 m)     Head Circumference --      Peak Flow --      Pain Score 08/09/22 0937 9     Pain Loc --      Pain Edu? --      Excl. in [place]? --     Most recent vital signs: Vitals:   08/09/22 0921  BP: 118/74  Pulse: 89  Resp: 18  Temp: 97.9 F (36.6 C)  SpO2: 97%     General: Awake, no distress.   CV:  Good peripheral perfusion. regular rate and  rhythm Resp:  Normal effort. Lungs cta Abd:  No distention.  Nontender Other:      ED Results / Procedures / Treatments   Labs (all labs ordered are listed, but only abnormal results are displayed) Labs Reviewed  RESP PANEL BY RT-PCR (RSV, FLU A&B, COVID)  RVPGX2 - Abnormal; Notable for the following components:      Result Value   SARS Coronavirus 2 by RT PCR POSITIVE (*)    All other components within normal limits  COMPREHENSIVE METABOLIC PANEL - Abnormal; Notable for the following components:   Calcium 8.4 (*)    All other components within normal limits  CBC - Abnormal; Notable for the following components:   RBC 5.17 (*)    MCV 79.9 (*)    MCH 25.3 (*)    All other  components within normal limits  URINALYSIS, ROUTINE W REFLEX MICROSCOPIC - Abnormal; Notable for the following components:   Color, Urine YELLOW (*)    APPearance HAZY (*)    Leukocytes,Ua SMALL (*)    Bacteria, UA RARE (*)    All other components within normal limits  LIPASE, BLOOD  POC URINE PREG, ED     EKG     RADIOLOGY     PROCEDURES:   Procedures   MEDICATIONS ORDERED IN ED: Medications  ondansetron (ZOFRAN-ODT) disintegrating tablet 4 mg (4 mg Oral Given 08/09/22 1009)  oxyCODONE-acetaminophen (PERCOCET/ROXICET) 5-325 MG per tablet 1 tablet (1 tablet Oral Given 08/09/22 1100)     IMPRESSION / MDM / ASSESSMENT AND PLAN / ED COURSE  I reviewed the triage vital signs and the nursing notes.                              Differential diagnosis  includes, but is not limited to, COVID, influenza, RSV, gastroenteritis, pregnancy  Patient's presentation is most consistent with acute complicated illness / injury requiring diagnostic workup.   Pregnancy is less likely as her pregnancy test is negative.  Patient was given Zofran 4 mg ODT for nausea  Respiratory panel was positive for COVID, remainder of her labs are reassuring  I did explain the findings to the patient.  She had been given Zofran ODT while here in the ED.  She be given a Percocet p.o. for pain.  She continues to complain of headache Metatone this is part of COVID symptoms.  She does not appear to have meningitis type symptoms.  However she was given strict instructions to return emergency department worsening.  Discharged in stable condition.      FINAL CLINICAL IMPRESSION(S) / ED DIAGNOSES   Final diagnoses:  COVID     Rx / DC Orders   ED Discharge Orders          Ordered    ondansetron (ZOFRAN-ODT) 4 MG disintegrating tablet  Every 8 hours PRN        08/09/22 1053             Note:  This document was prepared using Dragon voice recognition software and may include unintentional  dictation errors.    Versie Starks, PA-C 08/09/22 1136    Blake Divine, MD 08/09/22 [phone]

## 2022-08-09 NOTE — ED Notes (Signed)
Light green top, lavender top, and covid swab obtained and sent to lab at this time.

## 2022-08-09 NOTE — Discharge Instructions (Signed)
Take Tylenol and ibuprofen for fever and chills.  Also drink plenty fluids for pain and bodyaches.  Return the emergency department worsening

## 2022-08-09 NOTE — ED Triage Notes (Signed)
Pt to ED for HA, fatigue, vomiting and back pain since since about 2 days ago. Pt also has nasal congestion. Vomiting actually started this AM, 4-5 times today. Well-appearing.

## 2022-10-25 ENCOUNTER — Encounter (HOSPITAL_COMMUNITY): Payer: Self-pay

## 2022-10-25 ENCOUNTER — Ambulatory Visit (HOSPITAL_COMMUNITY)
Admission: EM | Admit: 2022-10-25 | Discharge: 2022-10-25 | Disposition: A | Discharge status: Home / Self Care | Payer: Medicaid Other | Attending: Emergency Medicine | Admitting: Emergency Medicine

## 2022-10-25 DIAGNOSIS — J069 Acute upper respiratory infection, unspecified: Secondary | ICD-10-CM

## 2022-10-25 MED ORDER — PROMETHAZINE-DM 6.25-15 MG/5ML PO SYRP: 5.0000 mL | ORAL_SOLUTION | Freq: Four times a day (QID) | ORAL | 0 refills | Status: DC | PRN

## 2022-10-25 MED ORDER — CETIRIZINE HCL 10 MG PO TABS: 10.0000 mg | ORAL_TABLET | Freq: Every day | ORAL | 2 refills | Status: AC

## 2022-10-25 NOTE — Discharge Instructions (Addendum)
You likely have a virus.  This is treated with symptomatic care. I recommend using the Promethazine DM 4 times daily as needed.  If this medicine makes you drowsy then just take before bedtime.  I recommend to start a once daily Zyrtec which will help with nasal congestion and runny nose.  You can also add a nasal spray.  Most important: make sure you are drinking lots of fluids and staying hydrated.  Your symptoms should improve over the next several days.  Please return if anything worsens.

## 2022-10-25 NOTE — ED Provider Notes (Signed)
[facility]    CSN: [id number] Arrival date & time: 10/25/22  1332      History   Chief Complaint Chief Complaint  Patient presents with   Cough    HPI Janet Greer is a 27 y.o. female.  Here with 5 to 6-day history of nasal congestion, runny nose, cough Cough is dry. No shortness of breath or wheezing. Also reports trouble with taste and smell, decreased appetite x 3 days No fever or chills. No NVD. Tolerating fluids No known sick contacts Has not taken any medications   No hx asthma or lung problems   Past Medical History:  Diagnosis Date   Anemia    GERD (gastroesophageal reflux disease)    rare   Headache    h/o migraines    Patient Active Problem List   Diagnosis Date Noted   Full-term premature rupture of membranes with onset of labor within 24 hours of rupture 12/06/2016   Indication for care in labor and delivery, antepartum 12/05/2016    Past Surgical History:  Procedure Laterality Date   CHOLECYSTECTOMY     WISDOM TOOTH EXTRACTION     2014    OB History     Gravida  1   Para      Term      Preterm      AB      Living         SAB      IAB      Ectopic      Multiple      Live Births               Home Medications    Prior to Admission medications   Medication Sig Start Date End Date Taking? Authorizing Provider  cetirizine (ZYRTEC ALLERGY) 10 MG tablet Take 1 tablet (10 mg total) by mouth daily. 10/25/22  Yes Rodric Punch, Lurena Joiner, PA-C  promethazine-dextromethorphan (PROMETHAZINE-DM) 6.25-15 MG/5ML syrup Take 5 mLs by mouth 4 (four) times daily as needed for cough. 10/25/22  Yes Steel Kerney, Lurena Joiner, PA-C  bismuth subsalicylate (PEPTO-BISMOL) 262 MG/15ML suspension Take 30 mLs by mouth every 6 (six) hours as needed for indigestion.    [provider]  calcium carbonate (TUMS - DOSED IN MG ELEMENTAL CALCIUM) 500 MG chewable tablet Chew 1 tablet by mouth as needed for indigestion or heartburn.    [provider]  etonogestrel (NEXPLANON) 68 MG IMPL implant 1 each by Subdermal route once.    [provider]  ondansetron (ZOFRAN-ODT) 4 MG disintegrating tablet Take 1 tablet (4 mg total) by mouth every 8 (eight) hours as needed. 08/09/22   Sherrie Mustache Roselyn Bering, PA-C    Family History History reviewed. No pertinent family history.  Social History Social History   Tobacco Use   Smoking status: Every Day    Types: E-cigarettes   Smokeless tobacco: Never  Vaping Use   Vaping Use: Every day   Substances: Nicotine  Substance Use Topics   Alcohol use: No   Drug use: No     Allergies   Morphine and Morphine and related   Review of Systems Review of Systems As per HPI  Physical Exam Triage Vital Signs ED Triage Vitals  Enc Vitals Group     BP 10/25/22 1504 91/65     Pulse Rate 10/25/22 1504 87     Resp 10/25/22 1504 18     Temp 10/25/22 1504 98 F (36.7 C)     Temp src --  SpO2 10/25/22 1504 92 %     Weight --      Height --      Head Circumference --      Peak Flow --      Pain Score 10/25/22 1505 0     Pain Loc --      Pain Edu? --      Excl. in GC? --    No data found.  Updated Vital Signs BP 91/65 (BP Location: Left Arm)   Pulse 87   Temp 98 F (36.7 C)   Resp 18   LMP 10/08/2022   SpO2 92% Comment: pt has nail polish on   Physical Exam Vitals and nursing note reviewed.  Constitutional:      General: She is not in acute distress.    Appearance: She is not ill-appearing.  HENT:     Right Ear: Tympanic membrane and ear canal normal.     Left Ear: Tympanic membrane and ear canal normal.     Nose: No rhinorrhea.     Comments: Mild congestion    Mouth/Throat:     Mouth: Mucous membranes are moist.     Pharynx: Oropharynx is clear. No posterior oropharyngeal erythema.  Eyes:     Conjunctiva/sclera: Conjunctivae normal.  Cardiovascular:     Rate and Rhythm: Normal rate and regular rhythm.     Pulses: Normal pulses.     Heart sounds:  Normal heart sounds.  Pulmonary:     Effort: Pulmonary effort is normal.     Breath sounds: Normal breath sounds.  Musculoskeletal:     Cervical back: Normal range of motion. No rigidity.  Skin:    General: Skin is warm and dry.  Neurological:     Mental Status: She is alert and oriented to person, place, and time.     UC Treatments / Results  Labs (all labs ordered are listed, but only abnormal results are displayed) Labs Reviewed - No data to display  EKG  Radiology No results found.  Procedures Procedures (including critical care time)  Medications Ordered in UC Medications - No data to display  Initial Impression / Assessment and Plan / UC Course  I have reviewed the triage vital signs and the nursing notes.  Pertinent labs & imaging results that were available during my care of the patient were reviewed by me and considered in my medical decision making (see chart for details).  Afebrile, well-appearing, clear lungs Discussed likely viral etiology.  Defer any testing today given duration of symptoms. Recommend symptomatic care at home.  Promethazine DM every 4 hours, Zyrtec, nasal spray, increasing fluids.  Discussed return precautions  Final Clinical Impressions(s) / UC Diagnoses   Final diagnoses:  Viral URI with cough     Discharge Instructions      You likely have a virus.  This is treated with symptomatic care. I recommend using the Promethazine DM 4 times daily as needed.  If this medicine makes you drowsy then just take before bedtime.  I recommend to start a once daily Zyrtec which will help with nasal congestion and runny nose.  You can also add a nasal spray.  Most important: make sure you are drinking lots of fluids and staying hydrated.  Your symptoms should improve over the next several days.  Please return if anything worsens.     ED Prescriptions     Medication Sig Dispense Auth. Provider   promethazine-dextromethorphan  (PROMETHAZINE-DM) 6.25-15 MG/5ML syrup Take 5 mLs by  mouth 4 (four) times daily as needed for cough. 180 mL Rolande Moe, PA-C   cetirizine (ZYRTEC ALLERGY) 10 MG tablet Take 1 tablet (10 mg total) by mouth daily. 30 tablet Auri Jahnke, Lurena Joiner, PA-C      PDMP not reviewed this encounter.   Hanifa Antonetti, Lurena Joiner, [place] 10/25/22 1524

## 2022-10-25 NOTE — ED Triage Notes (Signed)
Pt is here for cough, nasal congestion, SOB, runny nose, low energy, loss of taste and smell , loss of appetite ,  night sweats at night x 1wk

## 2022-11-17 ENCOUNTER — Emergency Department
Admission: EM | Admit: 2022-11-17 | Discharge: 2022-11-18 | Discharge status: Against Medical Advice | Payer: Medicaid Other | Attending: Emergency Medicine | Admitting: Emergency Medicine

## 2022-11-17 ENCOUNTER — Emergency Department: Payer: Medicaid Other

## 2022-11-17 ENCOUNTER — Other Ambulatory Visit: Payer: Self-pay

## 2022-11-17 DIAGNOSIS — R519 Headache, unspecified: Secondary | ICD-10-CM | POA: Diagnosis present

## 2022-11-17 LAB — URINALYSIS, ROUTINE W REFLEX MICROSCOPIC
Bacteria, UA: NONE SEEN
Bilirubin Urine: NEGATIVE
Glucose, UA: NEGATIVE mg/dL
Ketones, ur: NEGATIVE mg/dL
Leukocytes,Ua: NEGATIVE
Nitrite: NEGATIVE
Protein, ur: NEGATIVE mg/dL
Specific Gravity, Urine: 1.003 — ABNORMAL LOW (ref 1.005–1.030)
pH: 6 (ref 5.0–8.0)

## 2022-11-17 LAB — BASIC METABOLIC PANEL
Anion gap: 9 (ref 5–15)
BUN: 12 mg/dL (ref 6–20)
CO2: 22 mmol/L (ref 22–32)
Calcium: 8.6 mg/dL — ABNORMAL LOW (ref 8.9–10.3)
Chloride: 106 mmol/L (ref 98–111)
Creatinine, Ser: 0.64 mg/dL (ref 0.44–1.00)
GFR, Estimated: >60 mL/min (ref >60)
Glucose, Bld: 91 mg/dL (ref 70–99)
Potassium: 3.9 mmol/L (ref 3.5–5.1)
Sodium: 137 mmol/L (ref 135–145)

## 2022-11-17 LAB — CBC
HCT: 36.9 % (ref 36.0–46.0)
Hemoglobin: 12 g/dL (ref 12.0–15.0)
MCH: 26.2 pg (ref 26.0–34.0)
MCHC: 32.5 g/dL (ref 30.0–36.0)
MCV: 80.6 fL (ref 80.0–100.0)
Platelets: 300 10*3/uL (ref 150–400)
RBC: 4.58 MIL/uL (ref 3.87–5.11)
RDW: 14.2 % (ref 11.5–15.5)
WBC: 9.2 10*3/uL (ref 4.0–10.5)
nRBC: 0 % (ref 0.0–0.2)

## 2022-11-17 LAB — POC URINE PREG, ED: Preg Test, Ur: NEGATIVE

## 2022-11-17 MED ORDER — SODIUM CHLORIDE 0.9 % IV BOLUS
1000.0000 mL | Freq: Once | INTRAVENOUS | Status: AC
  Administered 2022-11-17: 1000 mL via INTRAVENOUS

## 2022-11-17 MED ORDER — KETOROLAC TROMETHAMINE 15 MG/ML IJ SOLN
15.0000 mg | Freq: Once | INTRAMUSCULAR | Status: DC
  Filled 2022-11-17: qty 1

## 2022-11-17 MED ORDER — DIPHENHYDRAMINE HCL 50 MG/ML IJ SOLN
50.0000 mg | Freq: Once | INTRAMUSCULAR | Status: AC
  Administered 2022-11-17: 50 mg via INTRAVENOUS
  Filled 2022-11-17: qty 1

## 2022-11-17 MED ORDER — METOCLOPRAMIDE HCL 5 MG/ML IJ SOLN
10.0000 mg | Freq: Once | INTRAMUSCULAR | Status: AC
  Administered 2022-11-17: 10 mg via INTRAVENOUS
  Filled 2022-11-17: qty 2

## 2022-11-17 NOTE — ED Provider Notes (Signed)
[facility] Provider Note  Patient Contact: 8:29 PM (approximate)   History   Dizziness and Headache   HPI  Janet Greer is a 27 y.o. female with a history of headaches, anemia and GERD, presents to the emergency department with frontal headache that is occurred intermittently over the past 3 days.  Patient denies fever, chills or neck pain.  She reports that the headache occasionally makes her dizzy.  No weakness in the upper and lower extremities.  No chest pain, chest tightness or abdominal pain.  Patient has had some nausea at home but no vomiting.      Physical Exam   Triage Vital Signs: ED Triage Vitals  Enc Vitals Group     BP 11/17/22 1809 108/77     Pulse Rate 11/17/22 1803 86     Resp --      Temp 11/17/22 1803 97.7 F (36.5 C)     Temp Source 11/17/22 1803 Oral     SpO2 11/17/22 1803 100 %     Weight 11/17/22 1805 205 lb (93 kg)     Height 11/17/22 1805 5' (1.524 m)     Head Circumference --      Peak Flow --      Pain Score 11/17/22 1805 9     Pain Loc --      Pain Edu? --      Excl. in GC? --     Most recent vital signs: Vitals:   11/17/22 1803 11/17/22 1809  BP:  108/77  Pulse: 86   Temp: 97.7 F (36.5 C)   SpO2: 100%      General: Alert and in no acute distress. Eyes:  PERRL. EOMI. Head: No acute traumatic findings ENT:      Nose: No congestion/rhinnorhea.      Mouth/Throat: Mucous membranes are moist. Neck: No stridor. No cervical spine tenderness to palpation. Cardiovascular:  Good peripheral perfusion Respiratory: Normal respiratory effort without tachypnea or retractions. Lungs CTAB. Good air entry to the bases with no decreased or absent breath sounds. Gastrointestinal: Bowel sounds 4 quadrants. Soft and nontender to palpation. No guarding or rigidity. No palpable masses. No distention. No CVA tenderness. Musculoskeletal: Full range of motion to all extremities.  Neurologic:  No gross focal neurologic  deficits are appreciated.  Skin:   No rash noted    ED Results / Procedures / Treatments   Labs (all labs ordered are listed, but only abnormal results are displayed) Labs Reviewed  BASIC METABOLIC PANEL - Abnormal; Notable for the following components:      Result Value   Calcium 8.6 (*)    All other components within normal limits  URINALYSIS, ROUTINE W REFLEX MICROSCOPIC - Abnormal; Notable for the following components:   Color, Urine COLORLESS (*)    APPearance CLEAR (*)    Specific Gravity, Urine 1.003 (*)    Hgb urine dipstick SMALL (*)    All other components within normal limits  CBC  POC URINE PREG, ED  CBG MONITORING, ED     EKG  Normal sinus rhythm without ST segment elevation or other apparent arrhythmia.   RADIOLOGY  I personally viewed and evaluated these images as part of my medical decision making, as well as reviewing the written report by the radiologist.  ED Provider Interpretation: No.  CT head without contrast shows no acute abnormality.   PROCEDURES:  Critical Care performed: No  Procedures   MEDICATIONS ORDERED IN ED: Medications  ketorolac (TORADOL) 15 MG/ML injection 15 mg (has no administration in time range)  sodium chloride 0.9 % bolus 1,000 mL (1,000 mLs Intravenous New Bag/Given 11/17/22 2133)  diphenhydrAMINE (BENADRYL) injection 50 mg (50 mg Intravenous Given 11/17/22 2133)  metoCLOPramide (REGLAN) injection 10 mg (10 mg Intravenous Given 11/17/22 2133)     IMPRESSION / MDM / ASSESSMENT AND PLAN / ED COURSE  I reviewed the triage vital signs and the nursing notes.                              Assessment and plan: Headache:   27 year old female presents to the emergency department with frontal headache for the past 3 days.  Vital signs are reassuring at triage.  On exam, patient was alert, active and nontoxic-appearing.  And BMP were reassuring.  Urinalysis shows no signs of UTI.  CT head without contrast reassuring.   Patient had overall improvement after Benadryl and Reglan were administered.  Anticipating giving patient IV Toradol but she eloped from the emergency department before Toradol could be administered.  Return precautions were given to return with new or worsening symptoms.  All patient questions were answered.   FINAL CLINICAL IMPRESSION(S) / ED DIAGNOSES   Final diagnoses:  Acute nonintractable headache, unspecified headache type     Rx / DC Orders   ED Discharge Orders     None        Note:  This document was prepared using Dragon voice recognition software and may include unintentional dictation errors.   Pia Mau [place], PA-C 11/17/22 2317    Merwyn Katos, MD 11/17/22 2322

## 2022-11-17 NOTE — ED Triage Notes (Signed)
Pt to ED POV for dizziness and frontal HA since 3 days ago. Pt denies recent illness. Denies pregnancy. Currently spotting, last normal period early March but has Nexlpanon. Pt walks with steady gait. No vision changes or weakness.

## 2022-11-18 ENCOUNTER — Ambulatory Visit (HOSPITAL_COMMUNITY)
Admission: EM | Admit: 2022-11-18 | Discharge: 2022-11-18 | Disposition: A | Discharge status: Home / Self Care | Payer: Medicaid Other | Attending: Family Medicine | Admitting: Family Medicine

## 2022-11-18 ENCOUNTER — Encounter (HOSPITAL_COMMUNITY): Payer: Self-pay

## 2022-11-18 DIAGNOSIS — R519 Headache, unspecified: Secondary | ICD-10-CM

## 2022-11-18 MED ORDER — ONDANSETRON 4 MG PO TBDP
4.0000 mg | ORAL_TABLET | Freq: Once | ORAL | Status: AC
  Administered 2022-11-18: 4 mg via ORAL

## 2022-11-18 MED ORDER — DEXAMETHASONE SODIUM PHOSPHATE 10 MG/ML IJ SOLN
INTRAMUSCULAR | Status: AC
  Filled 2022-11-18: qty 1

## 2022-11-18 MED ORDER — DEXAMETHASONE SODIUM PHOSPHATE 10 MG/ML IJ SOLN
10.0000 mg | Freq: Once | INTRAMUSCULAR | Status: AC
  Administered 2022-11-18: 10 mg via INTRAMUSCULAR

## 2022-11-18 MED ORDER — KETOROLAC TROMETHAMINE 30 MG/ML IJ SOLN
60.0000 mg | Freq: Once | INTRAMUSCULAR | Status: AC
  Administered 2022-11-18: 60 mg via INTRAMUSCULAR

## 2022-11-18 MED ORDER — KETOROLAC TROMETHAMINE 60 MG/2ML IM SOLN
INTRAMUSCULAR | Status: AC
  Filled 2022-11-18: qty 2

## 2022-11-18 MED ORDER — ONDANSETRON 4 MG PO TBDP
ORAL_TABLET | ORAL | Status: AC
  Filled 2022-11-18: qty 1

## 2022-11-18 NOTE — ED Notes (Signed)
Pt not in room, not found in restroom. Ivf infusing on floor. Int removed.

## 2022-11-18 NOTE — ED Triage Notes (Signed)
Patient here today with c/o HA, nausea, dizziness X 3-5 days. She has been having head congestion and ST since last night . She vomited on Monday. She has been taking IBU but not helping. No sick contacts, No recent travel.

## 2022-11-21 NOTE — ED Provider Notes (Signed)
[facility]   [id number] 11/18/22 Arrival Time: 1417  ASSESSMENT & PLAN:  1. Acute intractable headache, unspecified headache type    Meds ordered this encounter  Medications   ketorolac (TORADOL) 30 MG/ML injection 60 mg   dexamethasone (DECADRON) injection 10 mg   ondansetron (ZOFRAN-ODT) disintegrating tablet 4 mg   Normal neurological exam. Afebrile without nuchal rigidity. Discussed. Current presentation and symptoms are consistent with prior migraines. No indication for neurodiagnostic workup at this time.   Recommend:  Follow-up Information     Schedule an appointment as soon as possible for a visit  with [place].   Why: For follow up. Contact information: [address] [place] [address] [phone]         [facility] Emergency Department at [facility].   Specialty: Emergency Medicine Why: If worsening or failing to improve as anticipated. Contact information: [address] [id number] mc [place] [address] [phone]                 Reviewed expectations re: course of current medical issues. Questions answered. Outlined signs and symptoms indicating need for more acute intervention. Patient verbalized understanding. After Visit Summary given.   SUBJECTIVE: History from: Patient. Patient is able to give a clear and coherent history.  Janet Greer is a 27 y.o. female who was seen in ED yesterday; eloped. Still with headache. With nausea, dizziness; X 3-5 days; gradual onset. Some head congestion and ST since last night . She vomited on 48 hours ago; tolerating PO intake. She has been taking IBU without much help. No sick contacts, No recent travel.  Is ambulatory. Current HA has limited daily activities. Denies depression, loss of balance, muscle weakness, speech difficulties, and vision problems. Denies head injury.  OBJECTIVE:  Vitals:   11/18/22 1451  BP: 112/68   Pulse: (!) 111  Resp: 16  Temp: 98 F (36.7 C)  TempSrc: Oral  SpO2: 97%  Weight: 93 kg  Height: 5' (1.524 m)    Recheck P: 102  General appearance: alert; NAD but appears fatigued HENT: normocephalic; atraumatic Eyes: PERRLA; EOMI; conjunctivae normal Neck: supple with FROM Lungs: unlabored respirations Extremities: no edema; symmetrical with no gross deformities Skin: warm and dry Neurologic: alert; speech is fluent and clear without dysarthria or aphasia; CN 2-12 grossly intact; no facial droop; normal gait Psychological: alert and cooperative; normal mood and affect  Labs reviewed at this visit: Results for orders placed or performed during the hospital encounter of 11/17/22  Basic metabolic panel  Result Value Ref Range   Sodium 137 135 - 145 mmol/L   Potassium 3.9 3.5 - 5.1 mmol/L   Chloride 106 98 - 111 mmol/L   CO2 22 22 - 32 mmol/L   Glucose, Bld 91 70 - 99 mg/dL   BUN 12 6 - 20 mg/dL   Creatinine, Ser 2.72 0.44 - 1.00 mg/dL   Calcium 8.6 (L) 8.9 - 10.3 mg/dL   GFR, Estimated >53 >66 mL/min   Anion gap 9 5 - 15  CBC  Result Value Ref Range   WBC 9.2 4.0 - 10.5 K/uL   RBC 4.58 3.87 - 5.11 MIL/uL   Hemoglobin 12.0 12.0 - 15.0 g/dL   HCT 44.0 34.7 - 42.5 %   MCV 80.6 80.0 - 100.0 fL   MCH 26.2 26.0 - 34.0 pg   MCHC 32.5 30.0 - 36.0 g/dL   RDW 95.6 38.7 - 56.4 %   Platelets  300 150 - 400 K/uL   nRBC 0.0 0.0 - 0.2 %  Urinalysis, Routine w reflex microscopic -Urine, Clean Catch  Result Value Ref Range   Color, Urine COLORLESS (A) YELLOW   APPearance CLEAR (A) CLEAR   Specific Gravity, Urine 1.003 (L) 1.005 - 1.030   pH 6.0 5.0 - 8.0   Glucose, UA NEGATIVE NEGATIVE mg/dL   Hgb urine dipstick SMALL (A) NEGATIVE   Bilirubin Urine NEGATIVE NEGATIVE   Ketones, ur NEGATIVE NEGATIVE mg/dL   Protein, ur NEGATIVE NEGATIVE mg/dL   Nitrite NEGATIVE NEGATIVE   Leukocytes,Ua NEGATIVE NEGATIVE   RBC / HPF 0-5 0 - 5 RBC/hpf   WBC, UA 0-5 0 - 5 WBC/hpf   Bacteria,  UA NONE SEEN NONE SEEN   Squamous Epithelial / HPF 0-5 0 - 5 /HPF  POC urine preg, ED  Result Value Ref Range   Preg Test, Ur NEGATIVE NEGATIVE    Allergies  Allergen Reactions   Morphine Nausea And Vomiting   Morphine And Related Nausea And Vomiting    Pt states when given iv antiemetic prior to MSO4 she tolerates fine    Past Medical History:  Diagnosis Date   Anemia    GERD (gastroesophageal reflux disease)    rare   Headache    h/o migraines   Social History   Socioeconomic History   Marital status: Single    Spouse name: Not on file   Number of children: Not on file   Years of education: Not on file   Highest education level: Not on file  Occupational History   Not on file  Tobacco Use   Smoking status: Every Day    Types: E-cigarettes   Smokeless tobacco: Never  Vaping Use   Vaping Use: Every day   Substances: Nicotine  Substance and Sexual Activity   Alcohol use: No   Drug use: No   Sexual activity: Yes    Birth control/protection: Implant  Other Topics Concern   Not on file  Social History Narrative   Not on file   Social Determinants of Health   Financial Resource Strain: Not on file  Food Insecurity: Not on file  Transportation Needs: Not on file  Physical Activity: Not on file  Stress: Not on file  Social Connections: Not on file  Intimate Partner Violence: Not on file   History reviewed. No pertinent family history. Past Surgical History:  Procedure Laterality Date   CHOLECYSTECTOMY     WISDOM TOOTH EXTRACTION     2014      Mardella Layman, MD 11/21/22 [id number]

## 2023-08-04 ENCOUNTER — Ambulatory Visit
Admission: EM | Admit: 2023-08-04 | Discharge: 2023-08-04 | Disposition: A | Discharge status: Home / Self Care | Payer: 59 | Attending: Emergency Medicine | Admitting: Emergency Medicine

## 2023-08-04 DIAGNOSIS — A084 Viral intestinal infection, unspecified: Secondary | ICD-10-CM

## 2023-08-04 MED ORDER — ONDANSETRON 4 MG PO TBDP: 4.0000 mg | ORAL_TABLET | Freq: Three times a day (TID) | ORAL | 0 refills | Status: DC | PRN

## 2023-08-04 NOTE — Discharge Instructions (Addendum)
 Take the antinausea medication as directed.    Keep yourself hydrated with clear liquids, such as water.  Follow the diarrhea diet as tolerated.   Go to the emergency department if you have worsening symptoms.    Follow up with your primary care provider.

## 2023-08-04 NOTE — ED Triage Notes (Signed)
 Patient to Urgent Care with complaints of  NVD/ headaches. Denies any known fevers.  Reports symptoms started three days ago.

## 2023-08-04 NOTE — ED Provider Notes (Signed)
 Janet Greer    CSN: [id number] Arrival date & time: 08/04/23  1015      History   Chief Complaint Chief Complaint  Patient presents with   Nausea   Emesis    HPI Janet Greer is a 28 y.o. female.  Patient presents with 3-day history of nausea, vomiting, diarrhea, headache.  No OTC medications taken today.  No fever, abdominal pain, dysuria.  The history is provided by the patient and medical records.    Past Medical History:  Diagnosis Date   Anemia    GERD (gastroesophageal reflux disease)    rare   Headache    h/o migraines    Patient Active Problem List   Diagnosis Date Noted   Full-term premature rupture of membranes with onset of labor within 24 hours of rupture 12/06/2016   Indication for care in labor and delivery, antepartum 12/05/2016    Past Surgical History:  Procedure Laterality Date   CHOLECYSTECTOMY     WISDOM TOOTH EXTRACTION     2014    OB History     Gravida  1   Para      Term      Preterm      AB      Living         SAB      IAB      Ectopic      Multiple      Live Births               Home Medications    Prior to Admission medications   Medication Sig Start Date End Date Taking? Authorizing Provider  ondansetron  (ZOFRAN -ODT) 4 MG disintegrating tablet Take 1 tablet (4 mg total) by mouth every 8 (eight) hours as needed for nausea or vomiting. 08/04/23  Yes Wellington Half, NP  bismuth subsalicylate (PEPTO-BISMOL) 262 MG/15ML suspension Take 30 mLs by mouth every 6 (six) hours as needed for indigestion.    [provider]  calcium carbonate (TUMS - DOSED IN MG ELEMENTAL CALCIUM) 500 MG chewable tablet Chew 1 tablet by mouth as needed for indigestion or heartburn.    [provider]  cetirizine  (ZYRTEC  ALLERGY) 10 MG tablet Take 1 tablet (10 mg total) by mouth daily. 10/25/22   Rising, Ivette Marks, PA-C  etonogestrel (NEXPLANON) 68 MG IMPL implant 1 each by Subdermal route once. Patient  not taking: Reported on 08/04/2023    [provider]    Family History History reviewed. No pertinent family history.  Social History Social History   Tobacco Use   Smoking status: Every Day    Types: E-cigarettes   Smokeless tobacco: Never  Vaping Use   Vaping status: Every Day   Substances: Nicotine  Substance Use Topics   Alcohol use: No   Drug use: No     Allergies   Morphine  and Morphine  and codeine   Review of Systems Review of Systems  Constitutional:  Negative for chills and fever.  Respiratory:  Negative for cough and shortness of breath.   Cardiovascular:  Negative for chest pain and palpitations.  Gastrointestinal:  Positive for diarrhea, nausea and vomiting. Negative for abdominal pain.  Neurological:  Positive for headaches.     Physical Exam Triage Vital Signs ED Triage Vitals  Encounter Vitals Group     BP 08/04/23 1046 127/79     Systolic BP Percentile --      Diastolic BP Percentile --      Pulse  Rate 08/04/23 1046 74     Resp 08/04/23 1046 18     Temp 08/04/23 1046 98.3 F (36.8 C)     Temp src --      SpO2 08/04/23 1046 97 %     Weight --      Height --      Head Circumference --      Peak Flow --      Pain Score 08/04/23 1102 2     Pain Loc --      Pain Education --      Exclude from Growth Chart --    No data found.  Updated Vital Signs BP 127/79   Pulse 74   Temp 98.3 F (36.8 C)   Resp 18   LMP 07/14/2023   SpO2 97%   Visual Acuity Right Eye Distance:   Left Eye Distance:   Bilateral Distance:    Right Eye Near:   Left Eye Near:    Bilateral Near:     Physical Exam Constitutional:      General: She is not in acute distress. HENT:     Mouth/Throat:     Mouth: Mucous membranes are moist.  Cardiovascular:     Rate and Rhythm: Normal rate and regular rhythm.     Heart sounds: Normal heart sounds.  Pulmonary:     Effort: Pulmonary effort is normal. No respiratory distress.     Breath sounds: Normal  breath sounds.  Abdominal:     General: Bowel sounds are normal.     Palpations: Abdomen is soft.     Tenderness: There is no abdominal tenderness. There is no guarding or rebound.  Neurological:     Mental Status: She is alert.      UC Treatments / Results  Labs (all labs ordered are listed, but only abnormal results are displayed) Labs Reviewed - No data to display  EKG   Radiology No results found.  Procedures Procedures (including critical care time)  Medications Ordered in UC Medications - No data to display  Initial Impression / Assessment and Plan / UC Course  I have reviewed the triage vital signs and the nursing notes.  Pertinent labs & imaging results that were available during my care of the patient were reviewed by me and considered in my medical decision making (see chart for details).    Viral gastroenteritis.  Afebrile and vital signs are stable.  Patient appears well-hydrated.  Treating nausea and vomiting with Zofran .  Discussed clear liquid diet.  Instructed patient to advance to diarrhea diet as tolerated.  Discussed maintaining oral hydration at home. ED precautions discussed.  Education provided on viral gastroenteritis.  Instructed patient to follow-up with her PCP as needed.  She agrees to plan of care.   Final Clinical Impressions(s) / UC Diagnoses   Final diagnoses:  Viral gastroenteritis     Discharge Instructions      Take the antinausea medication as directed.    Keep yourself hydrated with clear liquids, such as water.  Follow the diarrhea diet as tolerated.   Go to the emergency department if you have worsening symptoms.    Follow up with your primary care provider.          ED Prescriptions     Medication Sig Dispense Auth. Provider   ondansetron  (ZOFRAN -ODT) 4 MG disintegrating tablet Take 1 tablet (4 mg total) by mouth every 8 (eight) hours as needed for nausea or vomiting. 20 tablet Wellington Half,  NP      PDMP not  reviewed this encounter.   Wellington Half, NP 08/04/23 1128

## 2023-08-05 ENCOUNTER — Emergency Department
Admission: EM | Admit: 2023-08-05 | Discharge: 2023-08-05 | Disposition: A | Discharge status: Home / Self Care | Payer: 59 | Attending: Emergency Medicine | Admitting: Emergency Medicine

## 2023-08-05 ENCOUNTER — Other Ambulatory Visit: Payer: Self-pay

## 2023-08-05 ENCOUNTER — Encounter: Payer: Self-pay | Admitting: Intensive Care

## 2023-08-05 DIAGNOSIS — R112 Nausea with vomiting, unspecified: Secondary | ICD-10-CM | POA: Diagnosis not present

## 2023-08-05 DIAGNOSIS — R197 Diarrhea, unspecified: Secondary | ICD-10-CM | POA: Insufficient documentation

## 2023-08-05 DIAGNOSIS — R519 Headache, unspecified: Secondary | ICD-10-CM | POA: Insufficient documentation

## 2023-08-05 LAB — COMPREHENSIVE METABOLIC PANEL
ALT: 41 U/L (ref 0–44)
AST: 36 U/L (ref 15–41)
Albumin: 3.6 g/dL (ref 3.5–5.0)
Alkaline Phosphatase: 60 U/L (ref 38–126)
Anion gap: 9 (ref 5–15)
BUN: 14 mg/dL (ref 6–20)
CO2: 23 mmol/L (ref 22–32)
Calcium: 8.9 mg/dL (ref 8.9–10.3)
Chloride: 108 mmol/L (ref 98–111)
Creatinine, Ser: 0.85 mg/dL (ref 0.44–1.00)
GFR, Estimated: >60 mL/min (ref >60)
Glucose, Bld: 87 mg/dL (ref 70–99)
Potassium: 4 mmol/L (ref 3.5–5.1)
Sodium: 140 mmol/L (ref 135–145)
Total Bilirubin: 0.5 mg/dL (ref 0.0–1.2)
Total Protein: 7.6 g/dL (ref 6.5–8.1)

## 2023-08-05 LAB — URINALYSIS, ROUTINE W REFLEX MICROSCOPIC
Bilirubin Urine: NEGATIVE
Glucose, UA: NEGATIVE mg/dL
Hgb urine dipstick: NEGATIVE
Ketones, ur: NEGATIVE mg/dL
Leukocytes,Ua: NEGATIVE
Nitrite: NEGATIVE
Protein, ur: NEGATIVE mg/dL
Specific Gravity, Urine: 1.023 (ref 1.005–1.030)
pH: 5 (ref 5.0–8.0)

## 2023-08-05 LAB — CBC WITH DIFFERENTIAL/PLATELET
Abs Immature Granulocytes: 0.03 10*3/uL (ref 0.00–0.07)
Basophils Absolute: 0 10*3/uL (ref 0.0–0.1)
Basophils Relative: 0 %
Eosinophils Absolute: 0.2 10*3/uL (ref 0.0–0.5)
Eosinophils Relative: 2 %
HCT: 39 % (ref 36.0–46.0)
Hemoglobin: 12.6 g/dL (ref 12.0–15.0)
Immature Granulocytes: 0 %
Lymphocytes Relative: 22 %
Lymphs Abs: 1.9 10*3/uL (ref 0.7–4.0)
MCH: 26 pg (ref 26.0–34.0)
MCHC: 32.3 g/dL (ref 30.0–36.0)
MCV: 80.4 fL (ref 80.0–100.0)
Monocytes Absolute: 0.4 10*3/uL (ref 0.1–1.0)
Monocytes Relative: 5 %
Neutro Abs: 6.2 10*3/uL (ref 1.7–7.7)
Neutrophils Relative %: 71 %
Platelets: 297 10*3/uL (ref 150–400)
RBC: 4.85 MIL/uL (ref 3.87–5.11)
RDW: 13.6 % (ref 11.5–15.5)
WBC: 8.7 10*3/uL (ref 4.0–10.5)
nRBC: 0 % (ref 0.0–0.2)

## 2023-08-05 LAB — LIPASE, BLOOD: Lipase: 26 U/L (ref 11–51)

## 2023-08-05 LAB — POC URINE PREG, ED: Preg Test, Ur: NEGATIVE

## 2023-08-05 MED ORDER — METOCLOPRAMIDE HCL 5 MG/ML IJ SOLN
10.0000 mg | Freq: Once | INTRAMUSCULAR | Status: AC
  Administered 2023-08-05: 10 mg via INTRAVENOUS
  Filled 2023-08-05: qty 2

## 2023-08-05 MED ORDER — PROMETHAZINE HCL 12.5 MG PO TABS: 12.5000 mg | ORAL_TABLET | Freq: Four times a day (QID) | ORAL | 0 refills | Status: AC | PRN

## 2023-08-05 MED ORDER — KETOROLAC TROMETHAMINE 15 MG/ML IJ SOLN
15.0000 mg | Freq: Once | INTRAMUSCULAR | Status: AC
  Administered 2023-08-05: 15 mg via INTRAVENOUS
  Filled 2023-08-05: qty 1

## 2023-08-05 MED ORDER — SODIUM CHLORIDE 0.9 % IV BOLUS
1000.0000 mL | Freq: Once | INTRAVENOUS | Status: AC
  Administered 2023-08-05: 1000 mL via INTRAVENOUS

## 2023-08-05 NOTE — ED Provider Triage Note (Signed)
Emergency Medicine Provider Triage Evaluation Note  Janet Greer , a 28 y.o. female  was evaluated in triage.  Pt complains of low abd pain, n/v/d x2-3 days and mild headache. No fevers. No dysuria. No vaginal discharge.  Review of Systems  Positive: Abd pain, n/v/d Negative: fever  Physical Exam  BP (!) 125/96 (BP Location: Left Arm)   Pulse 73   Temp 98.5 F (36.9 C) (Oral)   Resp 16   LMP 07/14/2023   SpO2 97%  Gen:   Awake, no distress   Resp:  Normal effort  MSK:   Moves extremities without difficulty  Other:    Medical Decision Making  Medically screening exam initiated at 12:57 PM.  Appropriate orders placed.  Janet Greer was informed that the remainder of the evaluation will be completed by another provider, this initial triage assessment does not replace that evaluation, and the importance of remaining in the ED until their evaluation is complete.     Jackelyn Hoehn, PA-C 08/05/23 1259

## 2023-08-05 NOTE — Discharge Instructions (Signed)
You were seen in the emerged department today for evaluation of your headache, nausea, vomiting, and diarrhea.  Your testing was fortunately overall reassuring against an emergency cause for this.  I sent a prescription for nausea medicine to your pharmacy that you can take as needed.  This does make you drowsy, do not drive or operate machinery when taking this.  Return to the ER for new or worsening symptoms.  Otherwise, please follow with your primary care doctor as needed.

## 2023-08-05 NOTE — ED Provider Notes (Signed)
[facility] Provider Note    Event Date/Time   First MD Initiated Contact with Patient 08/05/23 1554     (approximate)   History   Headache and Emesis   HPI  Janet Greer is a 28 year old female presenting to the emergency department for evaluation of headache, vomiting, diarrhea.  Symptoms ongoing for a few days.  Reports multiple episodes of nonbloody vomiting and diarrhea today.  Reports mild lower abdominal pain.  She additionally reports ongoing dull headache, not sudden in onset.  Has been taking Tylenol and ibuprofen with limited improvement.  No numbness, tingling, focal weakness.  No vision changes.  No trauma.    Physical Exam   Triage Vital Signs: ED Triage Vitals  Encounter Vitals Group     BP 08/05/23 1256 (!) 125/96     Systolic BP Percentile --      Diastolic BP Percentile --      Pulse Rate 08/05/23 1256 73     Resp 08/05/23 1256 16     Temp 08/05/23 1256 98.5 F (36.9 C)     Temp Source 08/05/23 1256 Oral     SpO2 08/05/23 1256 97 %     Weight 08/05/23 1257 230 lb (104.3 kg)     Height 08/05/23 1257 5' (1.524 m)     Head Circumference --      Peak Flow --      Pain Score 08/05/23 1256 8     Pain Loc --      Pain Education --      Exclude from Growth Chart --     Most recent vital signs: Vitals:   08/05/23 1256  BP: (!) 125/96  Pulse: 73  Resp: 16  Temp: 98.5 F (36.9 C)  SpO2: 97%     General: Awake, interactive  CV:  Regular rate, good peripheral perfusion.  Resp:  Unlabored respirations, lungs clear to auscultation Abd:  Nondistended, soft, nontender to palpation Neuro:  Alert and oriented, normal extraocular movements, symmetric facial movement, sensation intact over bilateral upper and lower extremities with 5 out of 5 strength.  Normal finger-to-nose testing.   ED Results / Procedures / Treatments   Labs (all labs ordered are listed, but only abnormal results are displayed) Labs Reviewed   URINALYSIS, ROUTINE W REFLEX MICROSCOPIC - Abnormal; Notable for the following components:      Result Value   Color, Urine YELLOW (*)    APPearance HAZY (*)    All other components within normal limits  COMPREHENSIVE METABOLIC PANEL  CBC WITH DIFFERENTIAL/PLATELET  LIPASE, BLOOD  POC URINE PREG, ED     EKG EKG independently reviewed interpreted by myself (ER attending) demonstrates:    RADIOLOGY Imaging independently reviewed and interpreted by myself demonstrates:    PROCEDURES:  Critical Care performed: No  Procedures   MEDICATIONS ORDERED IN ED: Medications  sodium chloride 0.9 % bolus 1,000 mL (0 mLs Intravenous Stopped 08/05/23 1651)  metoCLOPramide (REGLAN) injection 10 mg (10 mg Intravenous Given 08/05/23 1624)  ketorolac (TORADOL) 15 MG/ML injection 15 mg (15 mg Intravenous Given 08/05/23 1624)     IMPRESSION / MDM / ASSESSMENT AND PLAN / ED COURSE  I reviewed the triage vital signs and the nursing notes.  Differential diagnosis includes, but is not limited to, viral illness, low suspicion acute intra-abdominal process given reassuring abdominal exam, no fevers, focal neurologic deficits, or other endings concerning for significant acute intracranial process  Patient's presentation is most consistent with acute  illness / injury with system symptoms.  28 year old female presenting with symptom suggestive of viral GI illness with associated headache.  Suspect headache likely related to ongoing viral symptoms.  Exam reassuring.  Labs sent from triage without critical derangements.  Negative UA.  Does report that she is driving, so she was treated symptomatically with IV fluids, Toradol, and Reglan.  On reevaluation, patient is requesting discharge.  Did not like how the Reglan made her feel, but would like to try a different antiemetic than Zofran, so will DC with prescription for Phenergan.  Strict return precautions provided.  Patient discharged in stable  condition.      FINAL CLINICAL IMPRESSION(S) / ED DIAGNOSES   Final diagnoses:  Acute nonintractable headache, unspecified headache type  Nausea vomiting and diarrhea     Rx / DC Orders   ED Discharge Orders          Ordered    promethazine (PHENERGAN) 12.5 MG tablet  Every 6 hours PRN        08/05/23 1655             Note:  This document was prepared using Dragon voice recognition software and may include unintentional dictation errors.   Trinna Post, MD 08/05/23 [phone]

## 2023-08-05 NOTE — ED Triage Notes (Signed)
Patient c/o lower abdominal pain, emesis, and diarrhea for a few days. Denies urinary symptoms.

## 2023-12-09 ENCOUNTER — Ambulatory Visit
Admission: EM | Admit: 2023-12-09 | Discharge: 2023-12-09 | Disposition: A | Discharge status: Home / Self Care | Attending: Emergency Medicine | Admitting: Emergency Medicine

## 2023-12-09 DIAGNOSIS — A084 Viral intestinal infection, unspecified: Secondary | ICD-10-CM | POA: Diagnosis not present

## 2023-12-09 MED ORDER — ONDANSETRON 4 MG PO TBDP: 4.0000 mg | ORAL_TABLET | Freq: Three times a day (TID) | ORAL | 0 refills | Status: AC | PRN

## 2023-12-09 NOTE — ED Provider Notes (Signed)
 Janet Greer    CSN: [id number] Arrival date & time: 12/09/23  7829      History   Chief Complaint Chief Complaint  Patient presents with   Nausea   Diarrhea    HPI Janet Greer is a 28 y.o. female.  Patient presents with 4-day history of nausea, diarrhea, fatigue.  She has had "dry heaving" but no vomiting of foods or liquids.  No fever, chills, abdominal pain, dysuria, hematuria.  No OTC medications taken.  No recent travel out of the country or antibiotic use.  The history is provided by the patient and medical records.    Past Medical History:  Diagnosis Date   Anemia    GERD (gastroesophageal reflux disease)    rare   Headache    h/o migraines    Patient Active Problem List   Diagnosis Date Noted   Full-term premature rupture of membranes with onset of labor within 24 hours of rupture 12/06/2016   Indication for care in labor and delivery, antepartum 12/05/2016    Past Surgical History:  Procedure Laterality Date   CHOLECYSTECTOMY     WISDOM TOOTH EXTRACTION     2014    OB History     Gravida  1   Para      Term      Preterm      AB      Living         SAB      IAB      Ectopic      Multiple      Live Births               Home Medications    Prior to Admission medications   Medication Sig Start Date End Date Taking? Authorizing Provider  ondansetron  (ZOFRAN -ODT) 4 MG disintegrating tablet Take 1 tablet (4 mg total) by mouth every 8 (eight) hours as needed for nausea or vomiting. 12/09/23  Yes Wellington Half, NP  bismuth subsalicylate (PEPTO-BISMOL) 262 MG/15ML suspension Take 30 mLs by mouth every 6 (six) hours as needed for indigestion.    [provider]  calcium carbonate (TUMS - DOSED IN MG ELEMENTAL CALCIUM) 500 MG chewable tablet Chew 1 tablet by mouth as needed for indigestion or heartburn.    [provider]  cetirizine  (ZYRTEC  ALLERGY) 10 MG tablet Take 1 tablet (10 mg total) by mouth  daily. 10/25/22   Rising, Ivette Marks, PA-C  etonogestrel (NEXPLANON) 68 MG IMPL implant 1 each by Subdermal route once. Patient not taking: Reported on 08/04/2023    [provider]  promethazine  (PHENERGAN ) 12.5 MG tablet Take 1 tablet (12.5 mg total) by mouth every 6 (six) hours as needed for up to 5 days for nausea or vomiting. Patient not taking: Reported on 12/09/2023 08/05/23 08/10/23  Claria Crofts, MD    Family History History reviewed. No pertinent family history.  Social History Social History   Tobacco Use   Smoking status: Every Day    Types: E-cigarettes   Smokeless tobacco: Never  Vaping Use   Vaping status: Every Day   Substances: Nicotine  Substance Use Topics   Alcohol use: No   Drug use: No     Allergies   Morphine  and Morphine  and codeine   Review of Systems Review of Systems  Constitutional:  Negative for chills and fever.  Gastrointestinal:  Positive for diarrhea and nausea. Negative for abdominal pain, blood in stool and vomiting.  Genitourinary:  Negative  for dysuria, flank pain and hematuria.     Physical Exam Triage Vital Signs ED Triage Vitals  Encounter Vitals Group     BP 12/09/23 0921 115/72     Systolic BP Percentile --      Diastolic BP Percentile --      Pulse Rate 12/09/23 0921 85     Resp 12/09/23 0921 18     Temp 12/09/23 0921 98.2 F (36.8 C)     Temp src --      SpO2 12/09/23 0921 97 %     Weight --      Height --      Head Circumference --      Peak Flow --      Pain Score 12/09/23 0919 7     Pain Loc --      Pain Education --      Exclude from Growth Chart --    No data found.  Updated Vital Signs BP 115/72   Pulse 85   Temp 98.2 F (36.8 C)   Resp 18   LMP 11/20/2023   SpO2 97%   Visual Acuity Right Eye Distance:   Left Eye Distance:   Bilateral Distance:    Right Eye Near:   Left Eye Near:    Bilateral Near:     Physical Exam Constitutional:      General: She is not in acute distress. HENT:      Mouth/Throat:     Mouth: Mucous membranes are moist.  Cardiovascular:     Rate and Rhythm: Normal rate and regular rhythm.  Pulmonary:     Effort: Pulmonary effort is normal. No respiratory distress.  Abdominal:     General: Bowel sounds are normal.     Palpations: Abdomen is soft.     Tenderness: There is no abdominal tenderness. There is no right CVA tenderness, left CVA tenderness, guarding or rebound.  Neurological:     Mental Status: She is alert.      UC Treatments / Results  Labs (all labs ordered are listed, but only abnormal results are displayed) Labs Reviewed - No data to display  EKG   Radiology No results found.  Procedures Procedures (including critical care time)  Medications Ordered in UC Medications - No data to display  Initial Impression / Assessment and Plan / UC Course  I have reviewed the triage vital signs and the nursing notes.  Pertinent labs & imaging results that were available during my care of the patient were reviewed by me and considered in my medical decision making (see chart for details).    Neuro gastroenteritis.  Afebrile and vital signs are stable.  No recent travel or antibiotic use.  Treating nausea with Zofran .  Discussed clear liquid diet.  Instructed patient to advance to diarrhea diet as tolerated.  Discussed maintaining oral hydration at home; ED precautions discussed.  Education provided on viral gastroenteritis.  Instructed patient to follow-up with her PCP.  She agrees to plan of care.   Final Clinical Impressions(s) / UC Diagnoses   Final diagnoses:  Viral gastroenteritis     Discharge Instructions      Take the antinausea medication as directed.    Keep yourself hydrated with clear liquids, such as water.  Follow the diarrhea diet as tolerated.   Go to the emergency department if you have worsening symptoms.    Follow up with your primary care provider.        ED Prescriptions  Medication Sig Dispense  Auth. Provider   ondansetron  (ZOFRAN -ODT) 4 MG disintegrating tablet Take 1 tablet (4 mg total) by mouth every 8 (eight) hours as needed for nausea or vomiting. 20 tablet Wellington Half, NP      PDMP not reviewed this encounter.   Wellington Half, NP 12/09/23 [phone]

## 2023-12-09 NOTE — Discharge Instructions (Addendum)
 Take the antinausea medication as directed.    Keep yourself hydrated with clear liquids, such as water.  Follow the diarrhea diet as tolerated.   Go to the emergency department if you have worsening symptoms.    Follow up with your primary care provider.

## 2023-12-09 NOTE — ED Triage Notes (Addendum)
 Patient to Urgent Care with complaints of nausea/ diarrhea/ fatigue. Denies any fevers.  Symptoms started Sunday night. Reports the last two nights symptoms have been worse. No sick contacts.

## 2023-12-10 ENCOUNTER — Telehealth: Payer: Self-pay

## 2023-12-10 NOTE — Telephone Encounter (Signed)
 The patient called to schedule an appointment at our office. I informed her that we currently do not have any available appointments for new patients. I offered to add her to our call-back list and assured her that we will contact her as soon as an appointment becomes available.

## 2024-02-04 ENCOUNTER — Ambulatory Visit (HOSPITAL_COMMUNITY)
Admission: EM | Admit: 2024-02-04 | Discharge: 2024-02-04 | Disposition: A | Discharge status: Home / Self Care | Attending: Emergency Medicine | Admitting: Emergency Medicine

## 2024-02-04 ENCOUNTER — Encounter (HOSPITAL_COMMUNITY): Payer: Self-pay | Admitting: *Deleted

## 2024-02-04 DIAGNOSIS — K5900 Constipation, unspecified: Secondary | ICD-10-CM | POA: Diagnosis not present

## 2024-02-04 DIAGNOSIS — R1013 Epigastric pain: Secondary | ICD-10-CM | POA: Diagnosis not present

## 2024-02-04 LAB — POCT URINE PREGNANCY: Preg Test, Ur: NEGATIVE

## 2024-02-04 MED ORDER — POLYETHYLENE GLYCOL 3350 17 G PO PACK
17.0000 g | PACK | Freq: Every day | ORAL | 0 refills | Status: AC
Start: 2024-02-04 — End: ?

## 2024-02-04 MED ORDER — OMEPRAZOLE 20 MG PO CPDR: 20.0000 mg | DELAYED_RELEASE_CAPSULE | Freq: Every day | ORAL | 0 refills | Status: AC

## 2024-02-04 NOTE — ED Triage Notes (Signed)
 Pt states her stomach is burning when she eat anything then 30 mins later she has diarrhea. Sh states this has been going on about a month. She tried pepto,maalox without any relief.

## 2024-02-04 NOTE — ED Provider Notes (Signed)
 [facility]    CSN: [id number] Arrival date & time: 02/04/24  1600      History   Chief Complaint Chief Complaint  Patient presents with   Abdominal Pain   Diarrhea    HPI Janet Greer is a 28 y.o. female.   Patient presents with epigastric pain and burning sensation in her stomach that usually occurs with eating but sometimes well occur on its own.  Patient states that the night after eating she will have diarrhea about 30 minutes after meds well.  Patient states that this has been going on for a few years, but states that has worsened over the last month.    Patient states that she does not have regular bowel movements daily.  Patient states that she will go 3 to 4 days without having a bowel movement.  Patient states that her last bowel movement was today and this was normal, but prior to today she did not have one for about 3 days.  Denies nausea, vomiting, fever, severe abdominal pain, and blood in stool.  Patient does report history of cholecystectomy.  Patient states that she has been taking Pepto and Maalox without relief.  LMP 7/5.  The history is provided by the patient and medical records.  Abdominal Pain Associated symptoms: diarrhea   Diarrhea Associated symptoms: abdominal pain     Past Medical History:  Diagnosis Date   Anemia    GERD (gastroesophageal reflux disease)    rare   Headache    h/o migraines    Patient Active Problem List   Diagnosis Date Noted   Full-term premature rupture of membranes with onset of labor within 24 hours of rupture 12/06/2016   Indication for care in labor and delivery, antepartum 12/05/2016    Past Surgical History:  Procedure Laterality Date   CHOLECYSTECTOMY     WISDOM TOOTH EXTRACTION     2014    OB History     Gravida  1   Para      Term      Preterm      AB      Living         SAB      IAB      Ectopic      Multiple      Live Births               Home  Medications    Prior to Admission medications   Medication Sig Start Date End Date Taking? Authorizing Provider  omeprazole (PRILOSEC) 20 MG capsule Take 1 capsule (20 mg total) by mouth daily. 02/04/24  Yes Johnie, Latanja Lehenbauer A, NP  polyethylene glycol (MIRALAX) 17 g packet Take 17 g by mouth daily. 02/04/24  Yes Johnie Flaming A, NP  bismuth subsalicylate (PEPTO-BISMOL) 262 MG/15ML suspension Take 30 mLs by mouth every 6 (six) hours as needed for indigestion.    [provider]  calcium carbonate (TUMS - DOSED IN MG ELEMENTAL CALCIUM) 500 MG chewable tablet Chew 1 tablet by mouth as needed for indigestion or heartburn.    [provider]  cetirizine  (ZYRTEC  ALLERGY) 10 MG tablet Take 1 tablet (10 mg total) by mouth daily. 10/25/22   Rising, [place], PA-C  etonogestrel (NEXPLANON) 68 MG IMPL implant 1 each by Subdermal route once. Patient not taking: Reported on 08/04/2023    [provider]  ondansetron  (ZOFRAN -ODT) 4 MG disintegrating tablet Take 1 tablet (4 mg total) by mouth every 8 (eight)  hours as needed for nausea or vomiting. 12/09/23   Corlis Burnard DEL, NP  promethazine  (PHENERGAN ) 12.5 MG tablet Take 1 tablet (12.5 mg total) by mouth every 6 (six) hours as needed for up to 5 days for nausea or vomiting. Patient not taking: Reported on 12/09/2023 08/05/23 08/10/23  Levander Slate, MD    Family History History reviewed. No pertinent family history.  Social History Social History   Tobacco Use   Smoking status: Never   Smokeless tobacco: Never  Vaping Use   Vaping status: Every Day   Substances: Nicotine  Substance Use Topics   Alcohol use: No   Drug use: Yes    Types: Marijuana     Allergies   Morphine  and Morphine  and codeine   Review of Systems Review of Systems  Gastrointestinal:  Positive for abdominal pain and diarrhea.   Per HPI  Physical Exam Triage Vital Signs ED Triage Vitals  Encounter Vitals Group     BP 02/04/24 1650 121/82     Girls  Systolic BP Percentile --      Girls Diastolic BP Percentile --      Boys Systolic BP Percentile --      Boys Diastolic BP Percentile --      Pulse Rate 02/04/24 1650 88     Resp 02/04/24 1650 18     Temp 02/04/24 1650 98.4 F (36.9 C)     Temp Source 02/04/24 1650 Oral     SpO2 02/04/24 1650 95 %     Weight --      Height --      Head Circumference --      Peak Flow --      Pain Score 02/04/24 1647 0     Pain Loc --      Pain Education --      Exclude from Growth Chart --    No data found.  Updated Vital Signs BP 121/82 (BP Location: Right Arm)   Pulse 88   Temp 98.4 F (36.9 C) (Oral)   Resp 18   LMP 01/22/2024 (Exact Date)   SpO2 95%   Visual Acuity Right Eye Distance:   Left Eye Distance:   Bilateral Distance:    Right Eye Near:   Left Eye Near:    Bilateral Near:     Physical Exam Vitals and nursing note reviewed.  Constitutional:      General: She is awake. She is not in acute distress.    Appearance: Normal appearance. She is well-developed and well-groomed. She is not ill-appearing.  Cardiovascular:     Rate and Rhythm: Normal rate and regular rhythm.  Pulmonary:     Effort: Pulmonary effort is normal.     Breath sounds: Normal breath sounds.  Abdominal:     General: Abdomen is protuberant. Bowel sounds are normal.     Palpations: Abdomen is soft.     Tenderness: There is no abdominal tenderness. There is no guarding or rebound.  Skin:    General: Skin is warm and dry.  Neurological:     Mental Status: She is alert.  Psychiatric:        Behavior: Behavior is cooperative.      UC Treatments / Results  Labs (all labs ordered are listed, but only abnormal results are displayed) Labs Reviewed  POCT URINE PREGNANCY    EKG   Radiology No results found.  Procedures Procedures (including critical care time)  Medications Ordered in UC Medications - No data  to display  Initial Impression / Assessment and Plan / UC Course  I have  reviewed the triage vital signs and the nursing notes.  Pertinent labs & imaging results that were available during my care of the patient were reviewed by me and considered in my medical decision making (see chart for details).     Patient is overall well-appearing.  Vitals are stable.  Nontender palpation of abdomen.  Without signs of acute abdomen.  Urine pregnancy negative.  Recommended MiraLAX for constipation.  Prescribed omeprazole for indigestion.  Discussed follow-up, return, and strict ER precautions. Final Clinical Impressions(s) / UC Diagnoses   Final diagnoses:  Epigastric pain  Constipation, unspecified constipation type     Discharge Instructions      Start taking MiraLAX twice daily until you have a bowel movement daily.  And then you can start taking this once daily to help soften the stool. Make sure you are drinking lots of water with this for it to be the most effective. Eat foods high in fiber including fruits, vegetables, and whole grains. Take omeprazole once daily to help prevent indigestion. You can take over-the-counter Tums if you have any breakthrough indigestion. Follow-up with [facility] health community health and wellness for further evaluation and management. If you develop severe constant abdominal pain, excessive vomiting, blood in stool or vomit, or high fevers please seek immediate medical treatment in the emergency department.   ED Prescriptions     Medication Sig Dispense Auth. Provider   polyethylene glycol (MIRALAX) 17 g packet Take 17 g by mouth daily. 14 each Johnie Flaming A, NP   omeprazole (PRILOSEC) 20 MG capsule Take 1 capsule (20 mg total) by mouth daily. 30 capsule Johnie Flaming A, NP      PDMP not reviewed this encounter.   Johnie Flaming A, NP 02/04/24 [phone]

## 2024-02-04 NOTE — Discharge Instructions (Signed)
 Start taking MiraLAX twice daily until you have a bowel movement daily.  And then you can start taking this once daily to help soften the stool. Make sure you are drinking lots of water with this for it to be the most effective. Eat foods high in fiber including fruits, vegetables, and whole grains. Take omeprazole once daily to help prevent indigestion. You can take over-the-counter Tums if you have any breakthrough indigestion. Follow-up with [facility] health community health and wellness for further evaluation and management. If you develop severe constant abdominal pain, excessive vomiting, blood in stool or vomit, or high fevers please seek immediate medical treatment in the emergency department.
